# Patient Record
Sex: Male | Born: 1980
Health system: Southern US, Community
[De-identification: ages and names within clinical notes are randomized; demographics above are authoritative.]

## PROBLEM LIST (undated history)

## (undated) DIAGNOSIS — J45909 Unspecified asthma, uncomplicated: Secondary | ICD-10-CM

## (undated) DIAGNOSIS — J302 Other seasonal allergic rhinitis: Secondary | ICD-10-CM

## (undated) DIAGNOSIS — Z8619 Personal history of other infectious and parasitic diseases: Secondary | ICD-10-CM

## (undated) HISTORY — DX: Personal history of other infectious and parasitic diseases: Z86.19

## (undated) HISTORY — DX: Unspecified asthma, uncomplicated: J45.909

## (undated) HISTORY — DX: Other seasonal allergic rhinitis: J30.2

---
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
## 2004-07-21 ENCOUNTER — Ambulatory Visit: Payer: Self-pay | Admitting: Family Medicine

## 2006-01-31 ENCOUNTER — Ambulatory Visit: Payer: Self-pay | Admitting: Family Medicine

## 2006-07-11 ENCOUNTER — Ambulatory Visit: Payer: Self-pay | Admitting: Internal Medicine

## 2006-08-22 ENCOUNTER — Ambulatory Visit: Payer: Self-pay | Admitting: Internal Medicine

## 2006-08-29 ENCOUNTER — Ambulatory Visit: Payer: Self-pay | Admitting: Critical Care Medicine

## 2011-01-20 NOTE — Assessment & Plan Note (Signed)
[facility]                               PULMONARY OFFICE NOTE   Kenneth Nunez, Kenneth Nunez                   MRN:          [id number]  DATE:07/11/2006                            DOB:          1981-03-05    HISTORY:  [age]-year-old white male who was reported to have asthma in  11th or 12th grade, and was treated with p.r.n. inhalers.  He states he  did not require any form of maintenance or even p.r.n. inhaler subsequent to  age 30, and was able to play sports without difficulty.  He was able run up  to 3 miles in 30 minutes as of last March, but is now mostly just working  out with weights.  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 Ellis Parents request.  However, the patient denies any symptoms at all  to suggest asthma.  That is, he denies any nocturnal or exercise-induced  wheezing or shortness of breath, cough, chest tightness or decreased  activity tolerance.   PAST MEDICAL HISTORY:  Significant for asthma as a child only, as noted  above.   ALLERGIES:  None known.   MEDICATIONS:  None regularly.   SOCIAL HISTORY:  He has never smoked.  He presently works Engineer, materials  meters, but wants to become a Company secretary.   FAMILY HISTORY:  Taken in detail on the work sheet, significant for the  absence of atopy or asthma.   REVIEW OF SYSTEMS:  Taken in detail on the work sheet, and significant for  the absence of additional complaints.   PHYSICAL EXAMINATION:  This is a well-developed, muscular white male,  weighing 230 pounds at around 6 feet.  He is afebrile, stable vital signs.  HEENT:  Remarkable for normal turbinates.  The oropharynx is clear.  Dentition is intact, the ear canals were clear bilaterally.  NECK:  Supple without cervical adenopathy or tenderness.  The trachea was  midline, no thyromegaly.  Lung fields are perfectly clear  bilaterally to auscultation and percussion.  There is a regular rate and rhythm without murmur, gallop or rub.  ABDOMEN:  Soft, benign.  EXTREMITIES:  Warm without calf tenderness, cyanosis, clubbing or edema.    PFTs were repeated because of evidence of airflow obstruction on previous  studies, and show an FEV1 to FVC ratio of 59%, but an absolute FEV1 of 86%.  This did not change after bronchodilators.  The lung volumes and diffusion  capacity were normal.  Chest x-ray within the last month was also reviewed,  and is normal.   IMPRESSION:  This patient does indeed have mild persistent evident airflow  obstruction consistent with chronic asthma, but is below the radar screen  because he is not around anything that is triggering airways instability,  and is not exercising consistently at a very high aerobic level (otherwise I  believe he would be symptomatic).  Although he certainly might have a risk  of exacerbating during exposure to irritants, viruses, and certainly from  dust and  fume, I see no reason why he could not work as a IT sales professional and  use respiratory protective equipment effectively.   I would, however, be concerned about the possibility of a low-grade  remodeling here, resulting in chronic irreversible airflow observation,  albeit at a subclinical level, and recommended that the patient try QVAR 40  two puffs twice daily and then return here within 6 weeks for a followup  spirometry to see if have eliminated the pattern of airflow obstruction that  is present and irreversible, albeit extremely mild, at present.   I also spent extra time teaching this patient how to use metered dose  inhaler effectively, which he mastered fairly quickly, and I suggested that,  since he might be prone to exacerbations of asthma, recommended Xopenex 2  puffs every 4 hours as needed.    ______________________________  Charlaine Dalton Sherene Sires, MD, [facility]    MBW/MedQ  DD: 07/11/2006  DT:  07/12/2006  Job #: [id number]   cc:   Arta Silence, MD  Stan Head Cleta Alberts, M.D.

## 2011-01-20 NOTE — Assessment & Plan Note (Signed)
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  I saw him on  November 7 with no symptoms at all, with an FEV1 then of 86% predicted  but a ratio of 59% consistent with moderate airflow obstruction with no  improvement after bronchodilators and diffusion capacity that was  normal.  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 I did, however, question whether or not the  remodeling could be reversed with asthma treatment and initially  approached the problem by recommending Qvar 40 two puffs b.i.d. which he  says he has used regularly.  He has had no symptoms whatsoever while on  the medication (but note he had no symptoms prior to this).   PHYSICAL EXAMINATION NOW:  GENERAL:  He is a pleasant ambulatory white  male in no acute distress.  He is afebrile with normal vital signs.  HEENT:  Unremarkable, pharynx clear, there is no thrush.  Neck supple  without cervical adenopathy or tenderness.  Trachea is midline without  thyromegaly.  LUNG FIELDS:  Perfectly clear bilaterally to auscultation and  percussion.  HEART:  Regular rhythm without murmur, gallop or rub.  ABDOMEN:  Soft, benign.  EXTREMITIES:  Warm without calf tenderness, cyanosis, clubbing, or  edema.   PFTs were repeated and show the almost identical finding except the  FEV1/FVC ratio is up from 59 to 61.   IMPRESSION:  Mild airflow obstruction with no significant variability  typical of asthma.  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   To see to what extent he will improve with more aggressive treatment,  however, I recommended a trial of Symbicort 80/4.5 two puffs b.i.d. and  return here for spirometry in a week.  If this improves his lung  function more so than Qvar then I would continue this indefinitely.   In terms of his employability, I would not hesitate to recommend him for  employment, especially since he can do the aerobic component of the  requirements without any evidence of exercise-induced asthma or airways  instability.  In other words, he has been left because of remodeling  with a mild case of airflow obstruction that leads him to have a  ventilatory limitation that is minimum and would not impact him unless  he was pushed to a very high aerobic level, for  instance, sustained running at a marathon level (note this patient has  proven he can do extended running, at a minimum three 10-minute miles as  recently as  March 2007).     Charlaine Dalton. Sherene Sires, MD, [facility]  Electronically Signed    MBW/MedQ  DD: 08/22/2006  DT: 08/22/2006  Job #: [id number]   cc:   Arta Silence, MD

## 2011-01-20 NOTE — Assessment & Plan Note (Signed)
[facility]                             PULMONARY OFFICE NOTE   MARTEL, GALVAN                   MRN:          [id number]  DATE:08/29/2006                            DOB:          12-01-1980    HISTORY OF PRESENT ILLNESS:  The patient is a 30 year old, white male,  never smoker, patient of Dr. Thurston Hole who was recently seen in the office  one week ago for abnormal spirometry. The patient is applying to the  fire department and was noted to have an abnormal spirometry and was  sent over for evaluation. PFTs showed FEV1 of 86% of the predicted but a  ratio of 59% consistent with moderate air flow obstruction. The patient  was suspected to have low grade chronic asthma with possible remodeling.  The patient was initially started on Qvar and then switched over to  Symbicort 80/4.5 two puffs twice daily last week. The patient reports he  is tolerating medication well. The patient was asymptomatic. Denies any  wheezing, shortness of breath, decreased exercise tolerance, or  nocturnal symptoms.   PAST MEDICAL HISTORY:  Reviewed.   CURRENT MEDICATIONS:  Reviewed.   PHYSICAL EXAMINATION:  VITAL SIGNS:  The patient is afebrile with stable  vital signs.  HEENT:  Unremarkable.  LUNGS:  Lung sounds are clear.  CARDIAC:  Regular rate and rhythm.  ABDOMEN:  Soft and benign.  EXTREMITIES:  Warm without any edema.   DATA:  FEV1 today is 4.01 liters which is up from 79% to 84% of the  predicted. Mid flows are up at 2.5 liters which is up from 39% to 47% of  the predicted.   IMPRESSION/PLAN:  Mild air flow obstruction. The patient will continue  on Symbicort puffs 80/4.5 two puffs twice daily. Will recheck back here  in 2-3 months with Dr. Sherene Sires. 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      Rubye Oaks, NP  Electronically Signed      Charlaine Dalton.  Sherene Sires, MD, [facility]  Electronically Signed   TP/MedQ  DD: 08/30/2006  DT: 08/30/2006  Job #: [id number]

## 2012-02-08 ENCOUNTER — Ambulatory Visit: Payer: Self-pay | Admitting: Family Medicine

## 2012-02-13 ENCOUNTER — Ambulatory Visit: Payer: Self-pay | Admitting: Family Medicine

## 2013-04-16 ENCOUNTER — Encounter: Payer: Self-pay | Admitting: Family Medicine

## 2013-04-16 ENCOUNTER — Ambulatory Visit (INDEPENDENT_AMBULATORY_CARE_PROVIDER_SITE_OTHER): Payer: 59 | Admitting: Family Medicine

## 2013-04-16 VITALS — BP 102/64 | HR 64 | Temp 98.1°F | Ht 72.0 in | Wt 221.0 lb

## 2013-04-16 DIAGNOSIS — J309 Allergic rhinitis, unspecified: Secondary | ICD-10-CM

## 2013-04-16 DIAGNOSIS — J302 Other seasonal allergic rhinitis: Secondary | ICD-10-CM

## 2013-04-16 DIAGNOSIS — Z0000 Encounter for general adult medical examination without abnormal findings: Secondary | ICD-10-CM | POA: Insufficient documentation

## 2013-04-16 DIAGNOSIS — J45909 Unspecified asthma, uncomplicated: Secondary | ICD-10-CM

## 2013-04-16 MED ORDER — ALBUTEROL SULFATE HFA 108 (90 BASE) MCG/ACT IN AERS: 2.0000 | INHALATION_SPRAY | Freq: Four times a day (QID) | RESPIRATORY_TRACT | Status: DC | PRN

## 2013-04-16 NOTE — Assessment & Plan Note (Signed)
Mainly noted with seasonal allergic rhinitis flare. rec albuterol prn.

## 2013-04-16 NOTE — Progress Notes (Signed)
Subjective:    Patient ID: Kenneth Nunez, male    DOB: 10-13-1980, 32 y.o.   MRN: [id number]  HPI CC: new pt , re establish  Prior saw Dr. Hetty Ely.  Would like CPE today. Asthma - rare use of albuterol.  Seasonal.  Currently with some sinus congestion.  L knee - since college told had cartilage issues.  No pain.  Grinding when going up steps.  Seat belt use discussed Sunscreen use discussed.  No recent sun burns.  Caffeine: 2-3 cups coffee/day Lives with wife and daughter, 1 dog, 1 fish Occ: Firefighter Edu: bachelor's Activity: working out Diet: some water, fruits/vegetables daily  Preventative: Tetanus 2013. Blood work at work.  Medications and allergies reviewed and updated in chart.  Past histories reviewed and updated if relevant as below. There are no active problems to display for this patient.  Past Medical History  Diagnosis Date  . Asthma     mild, intermittent  . History of chicken pox   . Seasonal allergies    Past Surgical History  Procedure Laterality Date  . Tympanoplasty  1983   History  Substance Use Topics  . Smoking status: Never Smoker   . Smokeless tobacco: Former Neurosurgeon  . Alcohol Use: Yes     Comment: rarely   Family History  Problem Relation Age of Onset  . Depression Mother   . Hypertension Mother   . Hyperlipidemia Father   . CAD Maternal Grandmother 34    MI  . Hyperlipidemia Paternal Grandmother   . Cancer Neg Hx   . Stroke Neg Hx    No Known Allergies No current outpatient prescriptions on file prior to visit.   No current facility-administered medications on file prior to visit.     Review of Systems  Constitutional: Negative for fever, chills, activity change, appetite change, fatigue and unexpected weight change.  HENT: Negative for hearing loss and neck pain.   Eyes: Negative for visual disturbance.  Respiratory: Negative for cough, chest tightness, shortness of breath and wheezing.   Cardiovascular: Negative  for chest pain, palpitations and leg swelling.  Gastrointestinal: Negative for nausea, vomiting, abdominal pain, diarrhea, constipation, blood in stool and abdominal distention.  Genitourinary: Negative for hematuria and difficulty urinating.  Musculoskeletal: Negative for myalgias and arthralgias.  Skin: Negative for rash.  Neurological: Negative for dizziness, seizures, syncope and headaches.  Hematological: Negative for adenopathy. Does not bruise/bleed easily.  Psychiatric/Behavioral: Negative for dysphoric mood. The patient is not nervous/anxious.        Objective:   Physical Exam  Nursing note and vitals reviewed. Constitutional: He is oriented to person, place, and time. He appears well-developed and well-nourished. No distress.  HENT:  Head: Normocephalic and atraumatic.  Right Ear: Hearing, tympanic membrane, external ear and ear canal normal.  Left Ear: Hearing, tympanic membrane, external ear and ear canal normal.  Mouth/Throat: Oropharynx is clear and moist. No oropharyngeal exudate.  Eyes: Conjunctivae and EOM are normal. Pupils are equal, round, and reactive to light. No scleral icterus.  Neck: Normal range of motion. Neck supple. No thyromegaly present.  Cardiovascular: Normal rate, regular rhythm, normal heart sounds and intact distal pulses.   No murmur heard. Pulses:      Radial pulses are 2+ on the right side, and 2+ on the left side.  Pulmonary/Chest: Effort normal. No respiratory distress. He has no decreased breath sounds. He has wheezes (faint R sided). He has no rhonchi. He has no rales.  Abdominal: Soft. Bowel sounds  are normal. He exhibits no distension and no mass. There is no tenderness. There is no rebound and no guarding.  Musculoskeletal: Normal range of motion. He exhibits no edema.  Lymphadenopathy:    He has no cervical adenopathy.  Neurological: He is alert and oriented to person, place, and time.  CN grossly intact, station and gait intact  Skin:  Skin is warm and dry. No rash noted.  R AC fossa with large brown pigmented nevus, unchanging  Psychiatric: He has a normal mood and affect. His behavior is normal. Judgment and thought content normal.       Assessment & Plan:

## 2013-04-16 NOTE — Patient Instructions (Signed)
Check if tetanus shot was Td or Tdap (with pertussis) Bring me copy of next blood work to update your chart. I will review old records  Return as needed or in 2 years for next physical. I've sent albuterol to your pharmacy.

## 2013-04-16 NOTE — Assessment & Plan Note (Signed)
Preventative protocols reviewed and updated unless pt declined. Discussed healthy diet and lifestyle. He will check on recent tetanus. I asked him to bring me copy of upcoming blood work at work.

## 2013-08-31 ENCOUNTER — Emergency Department (HOSPITAL_COMMUNITY)
Admission: EM | Admit: 2013-08-31 | Discharge: 2013-08-31 | Disposition: A | Discharge status: Home / Self Care | Payer: 59 | Source: Home / Self Care | Attending: Family Medicine | Admitting: Family Medicine

## 2013-08-31 ENCOUNTER — Encounter (HOSPITAL_COMMUNITY): Payer: Self-pay | Admitting: Emergency Medicine

## 2013-08-31 DIAGNOSIS — J111 Influenza due to unidentified influenza virus with other respiratory manifestations: Secondary | ICD-10-CM

## 2013-08-31 MED ORDER — OSELTAMIVIR PHOSPHATE 75 MG PO CAPS: 75.0000 mg | ORAL_CAPSULE | Freq: Two times a day (BID) | ORAL | Status: DC

## 2013-08-31 MED ORDER — IBUPROFEN 800 MG PO TABS: 800.0000 mg | ORAL_TABLET | Freq: Three times a day (TID) | ORAL | Status: DC

## 2013-08-31 MED ORDER — HYDROCODONE-HOMATROPINE 5-1.5 MG/5ML PO SYRP: 5.0000 mL | ORAL_SOLUTION | Freq: Four times a day (QID) | ORAL | Status: DC | PRN

## 2013-08-31 NOTE — ED Notes (Signed)
Pt  Has  Symptoms  Of  Cough  Fever  And  Body  Aches /  Headache  X  2  Days    -  Pt  Sitting  Upright on  Exam table  Speaking in  Complete       sentances

## 2013-08-31 NOTE — ED Provider Notes (Signed)
CSN: [id number]     Arrival date & time 08/31/13  1825 History   First MD Initiated Contact with Patient 08/31/13 1943     Chief Complaint  Patient presents with  . Fever   (Consider location/radiation/quality/duration/timing/severity/associated sxs/prior Treatment) Patient is a 32 y.o. male presenting with cough. The history is provided by the patient. No language interpreter was used.  Cough Cough characteristics:  Productive Sputum characteristics:  Clear Severity:  Moderate Onset quality:  Gradual Timing:  Constant Chronicity:  New Relieved by:  Nothing Ineffective treatments:  None tried Associated symptoms: fever, headaches, myalgias and sore throat     Past Medical History  Diagnosis Date  . Extrinsic asthma     mild, intermittent  . History of chicken pox   . Seasonal allergic rhinitis    Past Surgical History  Procedure Laterality Date  . Tympanoplasty  1983   Family History  Problem Relation Age of Onset  . Depression Mother   . Hypertension Mother   . Hyperlipidemia Father   . CAD Maternal Grandmother 59    MI  . Hyperlipidemia Paternal Grandmother   . Cancer Neg Hx   . Stroke Neg Hx    History  Substance Use Topics  . Smoking status: Never Smoker   . Smokeless tobacco: Former Neurosurgeon  . Alcohol Use: Yes     Comment: rarely    Review of Systems  Constitutional: Positive for fever.  HENT: Positive for sore throat.   Respiratory: Positive for cough.   Musculoskeletal: Positive for myalgias.  Neurological: Positive for headaches.  All other systems reviewed and are negative.    Allergies  Review of patient's allergies indicates no known allergies.  Home Medications   Current Outpatient Rx  Name  Route  Sig  Dispense  Refill  . albuterol (PROVENTIL HFA;VENTOLIN HFA) 108 (90 BASE) MCG/ACT inhaler   Inhalation   Inhale 2 puffs into the lungs every 6 (six) hours as needed for wheezing.   1 Inhaler   3    Pulse 105  Temp(Src) 102.7 F (39.3  C) (Oral)  Resp 19  SpO2 93% Physical Exam  Nursing note and vitals reviewed. Constitutional: He is oriented to person, place, and time. He appears well-developed and well-nourished.  HENT:  Head: Normocephalic.  Right Ear: External ear normal.  Left Ear: External ear normal.  Eyes: EOM are normal. Pupils are equal, round, and reactive to light.  Neck: Normal range of motion.  Cardiovascular: Normal rate, regular rhythm and normal heart sounds.   Pulmonary/Chest: Effort normal.  Abdominal: He exhibits no distension.  Musculoskeletal: Normal range of motion.  Neurological: He is alert and oriented to person, place, and time.  Skin: Skin is warm.  Psychiatric: He has a normal mood and affect.    ED Course  Procedures (including critical care time) Labs Review Labs Reviewed - No data to display Imaging Review No results found.  EKG Interpretation    Date/Time:    Ventricular Rate:    PR Interval:    QRS Duration:   QT Interval:    QTC Calculation:   R Axis:     Text Interpretation:              MDM   1. Influenza    Ibuprofen 800mg ,  tamiflu and hydromet for cough    Lonia Skinner [place], PA-C 08/31/13 2005

## 2013-08-31 NOTE — ED Provider Notes (Signed)
Medical screening examination/treatment/procedure(s) were performed by non-physician practitioner and as supervising physician I was immediately available for consultation/collaboration.  Leslee Home, M.D.  Reuben Likes, MD 08/31/13 2107

## 2013-12-01 ENCOUNTER — Encounter: Payer: Self-pay | Admitting: Family Medicine

## 2013-12-01 ENCOUNTER — Ambulatory Visit (INDEPENDENT_AMBULATORY_CARE_PROVIDER_SITE_OTHER): Payer: 59 | Admitting: Family Medicine

## 2013-12-01 VITALS — BP 116/70 | HR 60 | Temp 97.8°F | Wt 228.0 lb

## 2013-12-01 DIAGNOSIS — H6120 Impacted cerumen, unspecified ear: Secondary | ICD-10-CM

## 2013-12-01 NOTE — Assessment & Plan Note (Signed)
R ear successfully manually disimpacted with plastic curette. L ear unsucessful attempts with plastic curette, alligator forceps and irrigation, so will refer to ENT, pt agrees.

## 2013-12-01 NOTE — Patient Instructions (Signed)
Pass by Kenneth Nunez's office for referral to ENT for ear wax removal.

## 2013-12-01 NOTE — Progress Notes (Signed)
   BP 116/70  Pulse 60  Temp(Src) 97.8 F (36.6 C) (Oral)  Wt 228 lb (103.42 kg)   CC: bilateral ear congestion  Subjective:    Patient ID: Kenneth Nunez, male    DOB: [birth year], 33 y.o.   MRN: [id number]  HPI: Kenneth Nunez is a 33 y.o. male presenting on 12/01/2013 for congestion in both ears   Over last week noticing some increased congestion in ears and muffled hearing.  No ear pain or drainage.  No fevers/chills, no sinus congestion or other allergic sxs like sneezing or rhinorrhea.  Tried debrox OTC drops without improvement. H/o tympanostomy tubes as infant.  Relevant past medical, surgical, family and social history reviewed and updated as indicated.  Allergies and medications reviewed and updated. Current Outpatient Prescriptions on File Prior to Visit  Medication Sig  . albuterol (PROVENTIL HFA;VENTOLIN HFA) 108 (90 BASE) MCG/ACT inhaler Inhale 2 puffs into the lungs every 6 (six) hours as needed for wheezing.  Marland Kitchen ibuprofen (ADVIL,MOTRIN) 800 MG tablet Take 1 tablet (800 mg total) by mouth 3 (three) times daily.   No current facility-administered medications on file prior to visit.    Review of Systems Per HPI unless specifically indicated above    Objective:    BP 116/70  Pulse 60  Temp(Src) 97.8 F (36.6 C) (Oral)  Wt 228 lb (103.42 kg)  Physical Exam  Nursing note and vitals reviewed. Constitutional: He appears well-developed and well-nourished. No distress.  HENT:  Head: Normocephalic and atraumatic.  Right Ear: Tympanic membrane and external ear normal.  Left Ear: External ear normal.  Mouth/Throat: Oropharynx is clear and moist. No oropharyngeal exudate.  R ear - hard dry wax removed with plastic curette, TM visualized and pearly grey L ear - canal full of soft wax that is not easily cleared with plastic curette so irrigation was performed - with remaining hard wax deep in canal unable to be fully removed with alligator forceps.  Unable to  visualize TM.  Post-irrigation irritation of external canal.  Eyes: Conjunctivae and EOM are normal. Pupils are equal, round, and reactive to light.  Neck: Normal range of motion. Neck supple.  Lymphadenopathy:    He has no cervical adenopathy.   No results found for this or any previous visit.    Assessment & Plan:   Problem List Items Addressed This Visit   Cerumen impaction - Primary     R ear successfully manually disimpacted with plastic curette. L ear unsucessful attempts with plastic curette, alligator forceps and irrigation, so will refer to ENT, pt agrees.    Relevant Orders      Ambulatory referral to ENT       Follow up plan: Return if symptoms worsen or fail to improve.

## 2013-12-01 NOTE — Progress Notes (Signed)
Pre visit review using our clinic review tool, if applicable. No additional management support is needed unless otherwise documented below in the visit note. 

## 2014-05-07 LAB — CBC
HGB: 15.8 g/dL
PLATELET COUNT: 205
WBC: 6.5

## 2014-05-08 LAB — COMPREHENSIVE METABOLIC PANEL
ALK PHOS: 59 U/L
ALT: 19
AST: 23 U/L
BUN: 12 mg/dL (ref 4–21)
Creat: 0.97
GLUCOSE: 104 mg/dL
Potassium: 4 mmol/L
Sodium: 139 mmol/L (ref 137–147)
Total Bilirubin: 0.5 mg/dL

## 2014-05-08 LAB — LIPID PANEL
Cholesterol, Total: 216
HDL: 64 mg/dL (ref 35–70)
LDL CALC: 139
TRIGLYCERIDES: 63

## 2014-06-05 ENCOUNTER — Ambulatory Visit (INDEPENDENT_AMBULATORY_CARE_PROVIDER_SITE_OTHER): Payer: 59 | Admitting: Family Medicine

## 2014-06-05 ENCOUNTER — Encounter: Payer: Self-pay | Admitting: Family Medicine

## 2014-06-05 VITALS — BP 104/60 | HR 70 | Temp 97.7°F | Wt 229.2 lb

## 2014-06-05 DIAGNOSIS — T148XXA Other injury of unspecified body region, initial encounter: Secondary | ICD-10-CM

## 2014-06-05 DIAGNOSIS — T148 Other injury of unspecified body region: Secondary | ICD-10-CM

## 2014-06-05 MED ORDER — CEPHALEXIN 500 MG PO CAPS: 500.0000 mg | ORAL_CAPSULE | Freq: Three times a day (TID) | ORAL | Status: DC

## 2014-06-05 NOTE — Patient Instructions (Addendum)
Take antibiotic course keflex 500mg  three times daily for 1 week. If persistent pain/redness after antibiotic, or any worsening let us know for referral to hand surgeon.

## 2014-06-05 NOTE — Progress Notes (Addendum)
   BP 104/60  Pulse 70  Temp(Src) 97.7 F (36.5 C) (Oral)  Wt 229 lb 4 oz (103.987 kg)  SpO2 96%   CC: hand pain  Subjective:    Patient ID: Kenneth Nunez, male    DOB: 09/13/1980, 33 y.o.   MRN: [id number]  HPI: Kenneth Nunez is a 33 y.o. male presenting on 06/05/2014 for Hand Pain   A few weeks ago while sanding and building daughter a bed suffered splinter into R palmar hand at 4th MCP. Area closed over. Last week splinter piece came out of opening at area of pain. Now having some pain lateral medial 3rd finger between MCP and PIP joints.  No fever, no warmth of fingers. Mild redness and swelling.  Relevant past medical, surgical, family and social history reviewed and updated as indicated.  Allergies and medications reviewed and updated. Current Outpatient Prescriptions on File Prior to Visit  Medication Sig  . albuterol (PROVENTIL HFA;VENTOLIN HFA) 108 (90 BASE) MCG/ACT inhaler Inhale 2 puffs into the lungs every 6 (six) hours as needed for wheezing.  Marland Kitchen ibuprofen (ADVIL,MOTRIN) 800 MG tablet Take 1 tablet (800 mg total) by mouth 3 (three) times daily.   No current facility-administered medications on file prior to visit.    Review of Systems Per HPI unless specifically indicated above    Objective:    BP 104/60  Pulse 70  Temp(Src) 97.7 F (36.5 C) (Oral)  Wt 229 lb 4 oz (103.987 kg)  SpO2 96%  Physical Exam  Nursing note and vitals reviewed. Constitutional: He appears well-developed and well-nourished. No distress.  Musculoskeletal: He exhibits no edema.  Right hand: Dry skin at palmar base of 4th MCP but no opening noted. Mild erythema, tenderness and induration along medial 3rd digit between MCP and PIP, no obvious foreign body palpated. FROM 3rd and 4th MCP/PIP.  Skin: Skin is warm and dry.  Psychiatric: He has a normal mood and affect.   No results found for this or any previous visit.    Assessment & Plan:   Problem List Items Addressed This  Visit   Splinter in skin - Primary     R palm, occurred several weeks ago, then last week part of splinter self-extruded out. Currently no evidence of infection at base of 4th MCP, but there is mild induration and erythema at medial 3rd digit between MCP and PIP - ?cellulitis vs retained splinter. Will treat as cellulitis with 7d keflex course. Discussed with patient - if persistent pain/swelling will recommend hand surgery referral. Pt agrees.  Tetanus UTD - per patient received 2013.        Follow up plan: Return if symptoms worsen or fail to improve.

## 2014-06-05 NOTE — Assessment & Plan Note (Addendum)
R palm, occurred several weeks ago, then last week part of splinter self-extruded out. Currently no evidence of infection at base of 4th MCP, but there is mild induration and erythema at medial 3rd digit between MCP and PIP - ?cellulitis vs retained splinter. Will treat as cellulitis with 7d keflex course. Discussed with patient - if persistent pain/swelling will recommend hand surgery referral. Pt agrees.  Tetanus UTD - per patient received 2013.

## 2014-06-05 NOTE — Progress Notes (Signed)
Pre visit review using our clinic review tool, if applicable. No additional management support is needed unless otherwise documented below in the visit note. 

## 2014-06-11 ENCOUNTER — Ambulatory Visit (INDEPENDENT_AMBULATORY_CARE_PROVIDER_SITE_OTHER): Payer: 59 | Admitting: *Deleted

## 2014-06-11 DIAGNOSIS — Z23 Encounter for immunization: Secondary | ICD-10-CM

## 2014-06-19 ENCOUNTER — Encounter: Payer: 59 | Admitting: Family Medicine

## 2014-07-27 ENCOUNTER — Encounter: Payer: Self-pay | Admitting: *Deleted

## 2016-06-30 ENCOUNTER — Other Ambulatory Visit: Payer: Self-pay | Admitting: Otolaryngology

## 2016-06-30 DIAGNOSIS — H9042 Sensorineural hearing loss, unilateral, left ear, with unrestricted hearing on the contralateral side: Secondary | ICD-10-CM

## 2016-07-11 ENCOUNTER — Ambulatory Visit
Admission: RE | Admit: 2016-07-11 | Discharge: 2016-07-11 | Disposition: A | Discharge status: Home / Self Care | Payer: 59 | Source: Ambulatory Visit | Attending: Otolaryngology | Admitting: Otolaryngology

## 2016-07-11 DIAGNOSIS — H9042 Sensorineural hearing loss, unilateral, left ear, with unrestricted hearing on the contralateral side: Secondary | ICD-10-CM

## 2016-07-11 MED ORDER — GADOBENATE DIMEGLUMINE 529 MG/ML IV SOLN
20.0000 mL | Freq: Once | INTRAVENOUS | Status: AC | PRN
  Administered 2016-07-11: 20 mL via INTRAVENOUS

## 2017-02-06 ENCOUNTER — Encounter: Payer: 59 | Admitting: Family Medicine

## 2017-02-20 DIAGNOSIS — H5213 Myopia, bilateral: Secondary | ICD-10-CM | POA: Diagnosis not present

## 2017-07-17 LAB — BASIC METABOLIC PANEL
Creatinine: 1 (ref 0.6–1.3)
Glucose: 83

## 2017-07-17 LAB — LIPID PANEL
Cholesterol: 239 — AB (ref 0–200)
HDL: 67 (ref 35–70)
LDL CALC: 153
TRIGLYCERIDES: 87 (ref 40–160)

## 2017-07-17 LAB — CBC AND DIFFERENTIAL
Hemoglobin: 16.6 (ref 13.5–17.5)
PLATELETS: 206 (ref 150–399)
WBC: 6.6

## 2017-07-17 LAB — HEPATIC FUNCTION PANEL
ALK PHOS: 90 (ref 25–125)
ALT: 30 (ref 10–40)
AST: 23 (ref 14–40)

## 2017-08-08 LAB — PULMONARY FUNCTION TEST

## 2017-08-23 ENCOUNTER — Telehealth: Payer: Self-pay | Admitting: Family Medicine

## 2017-08-23 NOTE — Telephone Encounter (Signed)
Copied from [place] [id number]. Topic: Quick Communication - See Telephone Encounter >> Aug 23, 2017 10:22 AM Boyd Kerbs wrote: CRM for notification. See Telephone encounter for:  He had labs draws at [place] drawn in the last 3 months. He was not sure if would need them again or can requests the results from them.  Please call if needs to schedule labs 08/23/17.

## 2017-08-23 NOTE — Telephone Encounter (Signed)
Ok to hold off on labs but I'd like him to get a copy of recent labs for our records. We don't have access to these.  If we can't get labs, may draw them when he sees me at [facility].

## 2017-08-23 NOTE — Telephone Encounter (Signed)
Left message on vm per dpr relaying message per Dr. G.  

## 2017-09-20 ENCOUNTER — Encounter: Payer: 59 | Admitting: Family Medicine

## 2017-09-24 ENCOUNTER — Encounter: Payer: 59 | Admitting: Family Medicine

## 2017-10-05 HISTORY — PX: VASECTOMY: SHX75

## 2017-10-12 DIAGNOSIS — Z302 Encounter for sterilization: Secondary | ICD-10-CM | POA: Diagnosis not present

## 2017-10-26 ENCOUNTER — Encounter: Payer: 59 | Admitting: Family Medicine

## 2017-11-07 ENCOUNTER — Ambulatory Visit (INDEPENDENT_AMBULATORY_CARE_PROVIDER_SITE_OTHER): Payer: 59 | Admitting: Family Medicine

## 2017-11-07 ENCOUNTER — Encounter: Payer: Self-pay | Admitting: Family Medicine

## 2017-11-07 VITALS — BP 118/80 | HR 61 | Temp 97.9°F | Ht 72.0 in | Wt 234.5 lb

## 2017-11-07 DIAGNOSIS — E7800 Pure hypercholesterolemia, unspecified: Secondary | ICD-10-CM | POA: Diagnosis not present

## 2017-11-07 DIAGNOSIS — E669 Obesity, unspecified: Secondary | ICD-10-CM

## 2017-11-07 DIAGNOSIS — J4520 Mild intermittent asthma, uncomplicated: Secondary | ICD-10-CM

## 2017-11-07 DIAGNOSIS — H9042 Sensorineural hearing loss, unilateral, left ear, with unrestricted hearing on the contralateral side: Secondary | ICD-10-CM

## 2017-11-07 DIAGNOSIS — Z0000 Encounter for general adult medical examination without abnormal findings: Secondary | ICD-10-CM

## 2017-11-07 DIAGNOSIS — E785 Hyperlipidemia, unspecified: Secondary | ICD-10-CM | POA: Insufficient documentation

## 2017-11-07 DIAGNOSIS — IMO0001 Reserved for inherently not codable concepts without codable children: Secondary | ICD-10-CM

## 2017-11-07 DIAGNOSIS — Z6825 Body mass index (BMI) 25.0-25.9, adult: Secondary | ICD-10-CM | POA: Insufficient documentation

## 2017-11-07 DIAGNOSIS — Z23 Encounter for immunization: Secondary | ICD-10-CM

## 2017-11-07 MED ORDER — ALBUTEROL SULFATE HFA 108 (90 BASE) MCG/ACT IN AERS: 2.0000 | INHALATION_SPRAY | Freq: Four times a day (QID) | RESPIRATORY_TRACT | 3 refills | Status: DC | PRN

## 2017-11-07 NOTE — Assessment & Plan Note (Signed)
Reviewed healthy diet and lifestyle changes.

## 2017-11-07 NOTE — Progress Notes (Signed)
BP 118/80 (BP Location: Left Arm, Patient Position: Sitting, Cuff Size: Normal)   Pulse 61   Temp 97.9 F (36.6 C) (Oral)   Ht 6' (1.829 m)   Wt 234 lb 8 oz (106.4 kg)   SpO2 97%   PF 800 L/min   BMI 31.80 kg/m    CC: [facility] Subjective:    Patient ID: Kenneth Nunez, male    DOB: 04-29-1981, 37 y.o.   MRN: [id number]  HPI: Kenneth Nunez is a 37 y.o. male presenting on 11/07/2017 for Annual Exam (Pt provided most recent labs (07/2017). )   Last seen 06/2014  Work physical yearly - brings records including labs which were reviewed, as well as audiogram showing L mild hearing loss at low frequencies (chronic tinnitus, has seen [facility] ENT, normal MRI) and spirometry showing moderate obstruction (FVC 93%, FEV1 72%, ratio 0.64)  Known mild intermittent asthma - triggers are mowing lawn during pollen spring season. Rare albuterol use. Requests albuterol refilled. No trouble at work.  Preventative: Flu shot yearly Td 2013, Tdap today. Seat belt use discussed Sunscreen use discussed. No changing moles on skin. Eye doctor regularly. Non smoker Alcohol - 3-4 beers/wk  Caffeine: 2-3 cups coffee/day Lives with wife, 3 children (2 daughters and 1 son) Occ: [employer]: bachelor's Activity: no regular exercise Diet: water, decaf tea or coffee, fruits/vegetables some   Relevant past medical, surgical, family and social history reviewed and updated as indicated. Interim medical history since our last visit reviewed. Allergies and medications reviewed and updated. Outpatient Medications Prior to Visit  Medication Sig Dispense Refill  . albuterol (PROVENTIL HFA;VENTOLIN HFA) 108 (90 BASE) MCG/ACT inhaler Inhale 2 puffs into the lungs every 6 (six) hours as needed for wheezing. 1 Inhaler 3  . cephALEXin (KEFLEX) 500 MG capsule Take 1 capsule (500 mg total) by mouth 3 (three) times daily. 21 capsule 0  . ibuprofen (ADVIL,MOTRIN) 800 MG tablet Take 1 tablet (800 mg total) by  mouth 3 (three) times daily. 21 tablet 0   No facility-administered medications prior to visit.      Per HPI unless specifically indicated in ROS section below Review of Systems  Constitutional: Negative for activity change, appetite change, chills, fatigue, fever and unexpected weight change.  HENT: Negative for hearing loss.   Eyes: Negative for visual disturbance.  Respiratory: Negative for cough, chest tightness, shortness of breath and wheezing.   Cardiovascular: Negative for chest pain, palpitations and leg swelling.  Gastrointestinal: Negative for abdominal distention, abdominal pain, blood in stool, constipation, diarrhea, nausea and vomiting.  Genitourinary: Negative for difficulty urinating and hematuria.  Musculoskeletal: Negative for arthralgias, myalgias and neck pain.  Skin: Negative for rash.  Neurological: Negative for dizziness, seizures, syncope and headaches.  Hematological: Negative for adenopathy. Does not bruise/bleed easily.  Psychiatric/Behavioral: Negative for dysphoric mood. The patient is not nervous/anxious.        Objective:    BP 118/80 (BP Location: Left Arm, Patient Position: Sitting, Cuff Size: Normal)   Pulse 61   Temp 97.9 F (36.6 C) (Oral)   Ht 6' (1.829 m)   Wt 234 lb 8 oz (106.4 kg)   SpO2 97%   PF 800 L/min   BMI 31.80 kg/m   Wt Readings from Last 3 Encounters:  11/07/17 234 lb 8 oz (106.4 kg)  06/05/14 229 lb 4 oz (104 kg)  12/01/13 228 lb (103.4 kg)    Physical Exam  Constitutional: He is oriented to person, place, and  time. He appears well-developed and well-nourished. No distress.  HENT:  Head: Normocephalic and atraumatic.  Right Ear: Hearing, tympanic membrane, external ear and ear canal normal.  Left Ear: Hearing, tympanic membrane, external ear and ear canal normal.  Nose: Nose normal.  Mouth/Throat: Uvula is midline, oropharynx is clear and moist and mucous membranes are normal. No oropharyngeal exudate, posterior  oropharyngeal edema or posterior oropharyngeal erythema.  Eyes: Conjunctivae and EOM are normal. Pupils are equal, round, and reactive to light. No scleral icterus.  Neck: Normal range of motion. Neck supple. No thyromegaly present.  Cardiovascular: Normal rate, regular rhythm, normal heart sounds and intact distal pulses.  No murmur heard. Pulses:      Radial pulses are 2+ on the right side, and 2+ on the left side.  Pulmonary/Chest: Effort normal and breath sounds normal. No respiratory distress. He has no wheezes. He has no rales.  Abdominal: Soft. Bowel sounds are normal. He exhibits no distension and no mass. There is no tenderness. There is no rebound and no guarding.  Musculoskeletal: Normal range of motion. He exhibits no edema.  Lymphadenopathy:    He has no cervical adenopathy.  Neurological: He is alert and oriented to person, place, and time.  CN grossly intact, station and gait intact  Skin: Skin is warm and dry. No rash noted.  Psychiatric: He has a normal mood and affect. His behavior is normal. Judgment and thought content normal.  Nursing note and vitals reviewed.   Baseline PF = 800 Expected for age/height = 640    Assessment & Plan:   Problem List Items Addressed This Visit    Asymmetrical left sensorineural hearing loss    S/p ENT eval with reassuring MRI - low frequencies       Extrinsic asthma    Albuterol refilled. Baseline peak flow today. Brings spirometry from work showing moderate obstruction (FVC 93%, FEV1 72%, ratio 0.64). No post-alb measurements taken.       Relevant Medications   albuterol (PROVENTIL HFA;VENTOLIN HFA) 108 (90 Base) MCG/ACT inhaler   Healthcare maintenance - Primary    Preventative protocols reviewed and updated unless pt declined. Discussed healthy diet and lifestyle.  Tdap today.       Hyperlipidemia    Chronic. Reviewed healthy diet choices to improve levels. The ASCVD Risk score Mikey Bussing DC Jr., et al., 2013) failed to  calculate for the following reasons:   The 2013 ASCVD risk score is only valid for ages 99 to 8       Obesity, Class I, BMI 30-34.9    Reviewed healthy diet and lifestyle changes.        Other Visit Diagnoses    Need for Tdap vaccination       Relevant Orders   Tdap vaccine greater than or equal to 7yo IM       Meds ordered this encounter  Medications  . albuterol (PROVENTIL HFA;VENTOLIN HFA) 108 (90 Base) MCG/ACT inhaler    Sig: Inhale 2 puffs into the lungs every 6 (six) hours as needed for wheezing.    Dispense:  1 Inhaler    Refill:  3   Orders Placed This Encounter  Procedures  . Tdap vaccine greater than or equal to 7yo IM    Follow up plan: Return in about 2 years (around 11/08/2019) for annual exam, prior fasting for blood work.  Ria Bush, MD

## 2017-11-07 NOTE — Assessment & Plan Note (Signed)
Albuterol refilled. Baseline peak flow today. Brings spirometry from work showing moderate obstruction (FVC 93%, FEV1 72%, ratio 0.64). No post-alb measurements taken.

## 2017-11-07 NOTE — Assessment & Plan Note (Addendum)
Chronic. Reviewed healthy diet choices to improve levels. The ASCVD Risk score Kenneth Bussing DC Jr., et al., 2013) failed to calculate for the following reasons:   The 2013 ASCVD risk score is only valid for ages 48 to 84

## 2017-11-07 NOTE — Patient Instructions (Addendum)
Peak flow today  Tdap today (tetanus and pertussis). Work on low cholesterol diet - more fruits/vegetables, more legumes (nuts, beans, lentils) to help lower LDL bad cholesterol You are doing well today. Return as needed or in 2 years for next physical.   Health Maintenance, Male A healthy lifestyle and preventive care is important for your health and wellness. Ask your health care provider about what schedule of regular examinations is right for you. What should I know about weight and diet? Eat a Healthy Diet  Eat plenty of vegetables, fruits, whole grains, low-fat dairy products, and lean protein.  Do not eat a lot of foods high in solid fats, added sugars, or salt.  Maintain a Healthy Weight Regular exercise can help you achieve or maintain a healthy weight. You should:  Do at least 150 minutes of exercise each week. The exercise should increase your heart rate and make you sweat (moderate-intensity exercise).  Do strength-training exercises at least twice a week.  Watch Your Levels of Cholesterol and Blood Lipids  Have your blood tested for lipids and cholesterol every 5 years starting at 36 years of age. If you are at high risk for heart disease, you should start having your blood tested when you are 36 years old. You may need to have your cholesterol levels checked more often if: ? Your lipid or cholesterol levels are high. ? You are older than 36 years of age. ? You are at high risk for heart disease.  What should I know about cancer screening? Many types of cancers can be detected early and may often be prevented. Lung Cancer  You should be screened every year for lung cancer if: ? You are a current smoker who has smoked for at least 30 years. ? You are a former smoker who has quit within the past 15 years.  Talk to your health care provider about your screening options, when you should start screening, and how often you should be screened.  Colorectal Cancer  Routine  colorectal cancer screening usually begins at 36 years of age and should be repeated every 5-10 years until you are 36 years old. You may need to be screened more often if early forms of precancerous polyps or small growths are found. Your health care provider may recommend screening at an earlier age if you have risk factors for colon cancer.  Your health care provider may recommend using home test kits to check for hidden blood in the stool.  A small camera at the end of a tube can be used to examine your colon (sigmoidoscopy or colonoscopy). This checks for the earliest forms of colorectal cancer.  Prostate and Testicular Cancer  Depending on your age and overall health, your health care provider may do certain tests to screen for prostate and testicular cancer.  Talk to your health care provider about any symptoms or concerns you have about testicular or prostate cancer.  Skin Cancer  Check your skin from head to toe regularly.  Tell your health care provider about any new moles or changes in moles, especially if: ? There is a change in a mole's size, shape, or color. ? You have a mole that is larger than a pencil eraser.  Always use sunscreen. Apply sunscreen liberally and repeat throughout the day.  Protect yourself by wearing long sleeves, pants, a wide-brimmed hat, and sunglasses when outside.  What should I know about heart disease, diabetes, and high blood pressure?  If you are 18-39 years  of age, have your blood pressure checked every 3-5 years. If you are 38 years of age or older, have your blood pressure checked every year. You should have your blood pressure measured twice-once when you are at a hospital or clinic, and once when you are not at a hospital or clinic. Record the average of the two measurements. To check your blood pressure when you are not at a hospital or clinic, you can use: ? An automated blood pressure machine at a pharmacy. ? A home blood pressure  monitor.  Talk to your health care provider about your target blood pressure.  If you are between 53-9 years old, ask your health care provider if you should take aspirin to prevent heart disease.  Have regular diabetes screenings by checking your fasting blood sugar level. ? If you are at a normal weight and have a low risk for diabetes, have this test once every three years after the age of 57. ? If you are overweight and have a high risk for diabetes, consider being tested at a younger age or more often.  A one-time screening for abdominal aortic aneurysm (AAA) by ultrasound is recommended for men aged 59-75 years who are current or former smokers. What should I know about preventing infection? Hepatitis B If you have a higher risk for hepatitis B, you should be screened for this virus. Talk with your health care provider to find out if you are at risk for hepatitis B infection. Hepatitis C Blood testing is recommended for:  Everyone born from [birth year] through 1964.  Anyone with known risk factors for hepatitis C.  Sexually Transmitted Diseases (STDs)  You should be screened each year for STDs including gonorrhea and chlamydia if: ? You are sexually active and are younger than 36 years of age. ? You are older than 36 years of age and your health care provider tells you that you are at risk for this type of infection. ? Your sexual activity has changed since you were last screened and you are at an increased risk for chlamydia or gonorrhea. Ask your health care provider if you are at risk.  Talk with your health care provider about whether you are at high risk of being infected with HIV. Your health care provider may recommend a prescription medicine to help prevent HIV infection.  What else can I do?  Schedule regular health, dental, and eye exams.  Stay current with your vaccines (immunizations).  Do not use any tobacco products, such as cigarettes, chewing tobacco, and  e-cigarettes. If you need help quitting, ask your health care provider.  Limit alcohol intake to no more than 2 drinks per day. One drink equals 12 ounces of beer, 5 ounces of wine, or 1 ounces of hard liquor.  Do not use street drugs.  Do not share needles.  Ask your health care provider for help if you need support or information about quitting drugs.  Tell your health care provider if you often feel depressed.  Tell your health care provider if you have ever been abused or do not feel safe at home. This information is not intended to replace advice given to you by your health care provider. Make sure you discuss any questions you have with your health care provider. Document Released: 02/17/2008 Document Revised: 04/19/2016 Document Reviewed: 05/25/2015 Elsevier Interactive Patient Education  [employer].

## 2017-11-07 NOTE — Assessment & Plan Note (Addendum)
S/p ENT eval with reassuring MRI - low frequencies

## 2017-11-07 NOTE — Assessment & Plan Note (Addendum)
Preventative protocols reviewed and updated unless pt declined. Discussed healthy diet and lifestyle.  Tdap today.

## 2018-01-15 DIAGNOSIS — L821 Other seborrheic keratosis: Secondary | ICD-10-CM | POA: Diagnosis not present

## 2018-01-15 DIAGNOSIS — L812 Freckles: Secondary | ICD-10-CM | POA: Diagnosis not present

## 2018-01-15 DIAGNOSIS — D2261 Melanocytic nevi of right upper limb, including shoulder: Secondary | ICD-10-CM | POA: Diagnosis not present

## 2018-02-28 DIAGNOSIS — H5213 Myopia, bilateral: Secondary | ICD-10-CM | POA: Diagnosis not present

## 2018-04-26 ENCOUNTER — Encounter: Payer: Self-pay | Admitting: Family Medicine

## 2018-04-26 ENCOUNTER — Ambulatory Visit: Payer: 59 | Admitting: Family Medicine

## 2018-04-26 VITALS — BP 108/78 | HR 58 | Temp 98.2°F | Wt 228.8 lb

## 2018-04-26 DIAGNOSIS — J020 Streptococcal pharyngitis: Secondary | ICD-10-CM | POA: Diagnosis not present

## 2018-04-26 DIAGNOSIS — J029 Acute pharyngitis, unspecified: Secondary | ICD-10-CM | POA: Diagnosis not present

## 2018-04-26 LAB — POCT RAPID STREP A (OFFICE): RAPID STREP A SCREEN: POSITIVE — AB

## 2018-04-26 MED ORDER — AMOXICILLIN 500 MG PO CAPS: 500.0000 mg | ORAL_CAPSULE | Freq: Three times a day (TID) | ORAL | 0 refills | Status: DC

## 2018-04-26 NOTE — Patient Instructions (Signed)
Take the amoxicillin as directed  Drink lots of fluids  Rest when you can   Tylenol or ibuprofen for sore throat or fever   Update if not starting to improve in a week or if worsening

## 2018-04-26 NOTE — Progress Notes (Signed)
Subjective:    Patient ID: Kenneth Nunez, male    DOB: Nov 08, 1980, 37 y.o.   MRN: [id number]  HPI  37 yo pt of Dr Darnell Level here with a ST He is leaving for [place] in the am   Results for orders placed or performed in visit on 04/26/18  POCT rapid strep A  Result Value Ref Range   Rapid Strep A Screen Positive (A) Negative    Exposed to strep - practically whole family   ST for several days  Not severe but persistent   Temp: 98.2 F (36.8 C)   A little soreness in his shoulders No chills   No other symptoms No congestion or cough  No rash   Patient Active Problem List   Diagnosis Date Noted  . Strep throat 04/26/2018  . Hyperlipidemia 11/07/2017  . Asymmetrical left sensorineural hearing loss 11/07/2017  . Obesity, Class I, BMI 30-34.9 11/07/2017  . Healthcare maintenance 04/16/2013  . Extrinsic asthma   . Seasonal allergic rhinitis    Past Medical History:  Diagnosis Date  . Extrinsic asthma    mild, intermittent  . History of chicken pox   . Seasonal allergic rhinitis    Past Surgical History:  Procedure Laterality Date  . TYMPANOPLASTY  1983  . VASECTOMY  10/2017   Social History   Tobacco Use  . Smoking status: Never Smoker  . Smokeless tobacco: Former Network engineer Use Topics  . Alcohol use: Yes    Comment: rarely  . Drug use: No   Family History  Problem Relation Age of Onset  . Depression Mother   . Hypertension Mother   . Hyperlipidemia Father   . CAD Maternal Grandmother 62       MI  . Hyperlipidemia Paternal Grandmother   . Cancer Neg Hx   . Stroke Neg Hx    No Known Allergies Current Outpatient Medications on File Prior to Visit  Medication Sig Dispense Refill  . albuterol (PROVENTIL HFA;VENTOLIN HFA) 108 (90 Base) MCG/ACT inhaler Inhale 2 puffs into the lungs every 6 (six) hours as needed for wheezing. 1 Inhaler 3   No current facility-administered medications on file prior to visit.     Review of Systems  Constitutional:  Positive for fatigue. Negative for activity change, appetite change, fever and unexpected weight change.  HENT: Positive for sore throat. Negative for congestion, ear pain, facial swelling, mouth sores, postnasal drip, rhinorrhea, trouble swallowing and voice change.   Eyes: Negative for pain, redness, itching and visual disturbance.  Respiratory: Negative for cough, chest tightness, shortness of breath and wheezing.   Cardiovascular: Negative for chest pain and palpitations.  Gastrointestinal: Negative for abdominal pain, blood in stool, constipation, diarrhea and nausea.  Endocrine: Negative for cold intolerance, heat intolerance, polydipsia and polyuria.  Genitourinary: Negative for difficulty urinating, dysuria, frequency and urgency.  Musculoskeletal: Negative for arthralgias, joint swelling and myalgias.  Skin: Negative for pallor and rash.  Neurological: Negative for dizziness, tremors, weakness, numbness and headaches.  Hematological: Negative for adenopathy. Does not bruise/bleed easily.  Psychiatric/Behavioral: Negative for decreased concentration and dysphoric mood. The patient is not nervous/anxious.        Objective:   Physical Exam  Constitutional: He appears well-developed and well-nourished. He does not appear ill. No distress.  Well appearing  HENT:  Head: Normocephalic and atraumatic.  Right Ear: Tympanic membrane normal.  Left Ear: Tympanic membrane normal.  Mouth/Throat: Mucous membranes are normal. No oral lesions. No uvula swelling. Posterior  oropharyngeal erythema present. No oropharyngeal exudate or tonsillar abscesses. Tonsils are 1+ on the right. Tonsils are 1+ on the left. No tonsillar exudate.  Mild tonsillar edema and erythema   Eyes: Pupils are equal, round, and reactive to light. EOM are normal.  Neck: Normal range of motion. Neck supple.  Cardiovascular: Normal rate, regular rhythm and normal heart sounds.  Pulmonary/Chest: Breath sounds normal. No  respiratory distress. He has no wheezes. He has no rales.  Lymphadenopathy:    He has cervical adenopathy.  Skin: Skin is warm and dry. No rash noted.  Psychiatric: He has a normal mood and affect.          Assessment & Plan:   Problem List Items Addressed This Visit      Respiratory   Strep throat - Primary    With multiple exposures recently/ pos RST Cover with amoxicillin 500 mg tid Disc symptomatic care - see instructions on AVS  Fluids/rest/analgesice  Update if not starting to improve in a week or if worsening         Other Visit Diagnoses    Sore throat       Relevant Orders   POCT rapid strep A (Completed)

## 2018-04-28 NOTE — Assessment & Plan Note (Signed)
With multiple exposures recently/ pos RST Cover with amoxicillin 500 mg tid Disc symptomatic care - see instructions on AVS  Fluids/rest/analgesice  Update if not starting to improve in a week or if worsening

## 2018-04-29 ENCOUNTER — Ambulatory Visit: Payer: 59 | Admitting: Internal Medicine

## 2018-05-20 DIAGNOSIS — H6611 Chronic tubotympanic suppurative otitis media, right ear: Secondary | ICD-10-CM | POA: Diagnosis not present

## 2018-05-20 DIAGNOSIS — H722X1 Other marginal perforations of tympanic membrane, right ear: Secondary | ICD-10-CM | POA: Diagnosis not present

## 2018-05-20 DIAGNOSIS — H6122 Impacted cerumen, left ear: Secondary | ICD-10-CM | POA: Diagnosis not present

## 2019-01-20 DIAGNOSIS — H5213 Myopia, bilateral: Secondary | ICD-10-CM | POA: Diagnosis not present

## 2019-10-30 ENCOUNTER — Encounter: Payer: Self-pay | Admitting: Family Medicine

## 2019-10-30 LAB — BASIC METABOLIC PANEL
Creatinine: 1.1 (ref 0.6–1.3)
Glucose: 87
Potassium: 4.2 (ref 3.4–5.3)
Sodium: 138 (ref 137–147)

## 2019-10-30 LAB — LIPID PANEL
Cholesterol: 236 — AB (ref 0–200)
HDL: 64 (ref 35–70)
LDL Cholesterol: 152
Triglycerides: 92 (ref 40–160)

## 2019-10-30 LAB — HEPATIC FUNCTION PANEL
ALT: 31 (ref 10–40)
AST: 28 (ref 14–40)
Alkaline Phosphatase: 65 (ref 25–125)
Bilirubin, Total: 0.6

## 2019-10-30 LAB — CBC AND DIFFERENTIAL
Hemoglobin: 15.8 (ref 13.5–17.5)
Platelets: 225 (ref 150–399)
WBC: 5.7

## 2020-01-13 ENCOUNTER — Other Ambulatory Visit: Payer: Self-pay

## 2020-01-13 ENCOUNTER — Ambulatory Visit (INDEPENDENT_AMBULATORY_CARE_PROVIDER_SITE_OTHER): Payer: 59 | Admitting: Family Medicine

## 2020-01-13 ENCOUNTER — Encounter: Payer: Self-pay | Admitting: Family Medicine

## 2020-01-13 VITALS — BP 116/74 | HR 73 | Temp 97.9°F | Ht 72.0 in | Wt 227.1 lb

## 2020-01-13 DIAGNOSIS — J4520 Mild intermittent asthma, uncomplicated: Secondary | ICD-10-CM | POA: Diagnosis not present

## 2020-01-13 DIAGNOSIS — E669 Obesity, unspecified: Secondary | ICD-10-CM

## 2020-01-13 DIAGNOSIS — H9042 Sensorineural hearing loss, unilateral, left ear, with unrestricted hearing on the contralateral side: Secondary | ICD-10-CM | POA: Diagnosis not present

## 2020-01-13 DIAGNOSIS — IMO0001 Reserved for inherently not codable concepts without codable children: Secondary | ICD-10-CM

## 2020-01-13 DIAGNOSIS — R1013 Epigastric pain: Secondary | ICD-10-CM

## 2020-01-13 DIAGNOSIS — E7800 Pure hypercholesterolemia, unspecified: Secondary | ICD-10-CM | POA: Diagnosis not present

## 2020-01-13 DIAGNOSIS — E66811 Obesity, class 1: Secondary | ICD-10-CM

## 2020-01-13 DIAGNOSIS — Z0000 Encounter for general adult medical examination without abnormal findings: Secondary | ICD-10-CM | POA: Diagnosis not present

## 2020-01-13 MED ORDER — ALBUTEROL SULFATE HFA 108 (90 BASE) MCG/ACT IN AERS: 2.0000 | INHALATION_SPRAY | Freq: Four times a day (QID) | RESPIRATORY_TRACT | 6 refills | Status: DC | PRN

## 2020-01-13 NOTE — Assessment & Plan Note (Addendum)
Episode last week of acute epigastric pain that lasted 1 hour and resolved on its own. ?GI cause - gastritis vs cholelithiasis. Possible viral enteritis as son was sick shortly thereafter. Does not sound cardiac in nature. He already avoids greasy fried foods.  If this recurs, advised to let us know for further evaluation including labs, EKG, and possible abd [place].

## 2020-01-13 NOTE — Patient Instructions (Addendum)
Cholesterol is remaining elevated.  Work on low cholesterol diet.  Look into mediterranean diet.  You are doing well. Albuterol refilled.  Bring me a copy yearly of labs to monitor.  Let me know if abdominal pain recurs for further evaluation.  Return in 1 year for next physical.       Mediterranean Diet  Why follow it? Research shows. . Those who follow the Mediterranean diet have a reduced risk of heart disease  . The diet is associated with a reduced incidence of Parkinson's and Alzheimer's diseases . People following the diet may have longer life expectancies and lower rates of chronic diseases  . The Dietary Guidelines for Americans recommends the Mediterranean diet as an eating plan to promote health and prevent disease  What Is the Mediterranean Diet?  . Healthy eating plan based on typical foods and recipes of Mediterranean-style cooking . The diet is primarily a plant based diet; these foods should make up a majority of meals   Starches - Plant based foods should make up a majority of meals - They are an important sources of vitamins, minerals, energy, antioxidants, and fiber - Choose whole grains, foods high in fiber and minimally processed items  - Typical grain sources include wheat, oats, barley, corn, brown rice, bulgar, farro, millet, polenta, couscous  - Various types of beans include chickpeas, lentils, fava beans, black beans, white beans   Fruits  Veggies - Large quantities of antioxidant rich fruits & veggies; 6 or more servings  - Vegetables can be eaten raw or lightly drizzled with oil and cooked  - Vegetables common to the traditional Mediterranean Diet include: artichokes, arugula, beets, broccoli, brussel sprouts, cabbage, carrots, celery, collard greens, cucumbers, eggplant, kale, leeks, lemons, lettuce, mushrooms, okra, onions, peas, peppers, potatoes, pumpkin, radishes, rutabaga, shallots, spinach, sweet potatoes, turnips, zucchini - Fruits common to the  Mediterranean Diet include: apples, apricots, avocados, cherries, clementines, dates, figs, grapefruits, grapes, melons, nectarines, oranges, peaches, pears, pomegranates, strawberries, tangerines  Fats - Replace butter and margarine with healthy oils, such as olive oil, canola oil, and tahini  - Limit nuts to no more than a handful a day  - Nuts include walnuts, almonds, pecans, pistachios, pine nuts  - Limit or avoid candied, honey roasted or heavily salted nuts - Olives are central to the [employer] - can be eaten whole or used in a variety of dishes   Meats Protein - Limiting red meat: no more than a few times a month - When eating red meat: choose lean cuts and keep the portion to the size of deck of cards - Eggs: approx. 0 to 4 times a week  - Fish and lean poultry: at least 2 a week  - Healthy protein sources include, chicken, [place], lean beef, lamb - Increase intake of seafood such as tuna, salmon, trout, mackerel, shrimp, scallops - Avoid or limit high fat processed meats such as sausage and bacon  Dairy - Include moderate amounts of low fat dairy products  - Focus on healthy dairy such as fat free yogurt, skim milk, low or reduced fat cheese - Limit dairy products higher in fat such as whole or 2% milk, cheese, ice cream  Alcohol - Moderate amounts of red wine is ok  - No more than 5 oz daily for women (all ages) and men older than age 65  - No more than 10 oz of wine daily for men younger than 74  Other - Limit sweets and other desserts  -  Use herbs and spices instead of salt to flavor foods  - Herbs and spices common to the traditional Mediterranean Diet include: basil, bay leaves, chives, cloves, cumin, fennel, garlic, lavender, marjoram, mint, oregano, parsley, pepper, rosemary, sage, savory, sumac, tarragon, thyme   It's not just a diet, it's a lifestyle:  . The Mediterranean diet includes lifestyle factors typical of those in the region  . Foods, drinks and meals are  best eaten with others and savored . Daily physical activity is important for overall good health . This could be strenuous exercise like running and aerobics . This could also be more leisurely activities such as walking, housework, yard-work, or taking the stairs . Moderation is the key; a balanced and healthy diet accommodates most foods and drinks . Consider portion sizes and frequency of consumption of certain foods   Meal Ideas & Options:  . Breakfast:  o Whole wheat toast or whole wheat English muffins with peanut butter & hard boiled egg o Steel cut oats topped with apples & cinnamon and skim milk  o Fresh fruit: banana, strawberries, melon, berries, peaches  o Smoothies: strawberries, bananas, greek yogurt, peanut butter o Low fat greek yogurt with blueberries and granola  o Egg white omelet with spinach and mushrooms o Breakfast couscous: whole wheat couscous, apricots, skim milk, cranberries  . Sandwiches:  o Hummus and grilled vegetables (peppers, zucchini, squash) on whole wheat bread   o Grilled chicken on whole wheat pita with lettuce, tomatoes, cucumbers or tzatziki  o Tuna salad on whole wheat bread: tuna salad made with greek yogurt, olives, red peppers, capers, green onions o Garlic rosemary lamb pita: lamb sauted with garlic, rosemary, salt & pepper; add lettuce, cucumber, greek yogurt to pita - flavor with lemon juice and black pepper  . Seafood:  o Mediterranean grilled salmon, seasoned with garlic, basil, parsley, lemon juice and black pepper o Shrimp, lemon, and spinach whole-grain pasta salad made with low fat greek yogurt  o Seared scallops with lemon orzo  o Seared tuna steaks seasoned salt, pepper, coriander topped with tomato mixture of olives, tomatoes, olive oil, minced garlic, parsley, green onions and cappers  . Meats:  o Herbed greek chicken salad with kalamata olives, cucumber, feta  o Red bell peppers stuffed with spinach, bulgur, lean ground beef (or  lentils) & topped with feta   o Kebabs: skewers of chicken, tomatoes, onions, zucchini, squash  o [place] burgers: made with red onions, mint, dill, lemon juice, feta cheese topped with roasted red peppers . Vegetarian o Cucumber salad: cucumbers, artichoke hearts, celery, red onion, feta cheese, tossed in olive oil & lemon juice  o Hummus and whole grain pita points with a greek salad (lettuce, tomato, feta, olives, cucumbers, red onion) o Lentil soup with celery, carrots made with vegetable broth, garlic, salt and pepper  o Tabouli salad: parsley, bulgur, mint, scallions, cucumbers, tomato, radishes, lemon juice, olive oil, salt and pepper.

## 2020-01-13 NOTE — Assessment & Plan Note (Signed)
Overall stable. Encouraged regular exercise routine

## 2020-01-13 NOTE — Assessment & Plan Note (Signed)
Preventative protocols reviewed and updated unless pt declined. Discussed healthy diet and lifestyle.  

## 2020-01-13 NOTE — Progress Notes (Signed)
This visit was conducted in person.  BP 116/74 (BP Location: Left Arm, Patient Position: Sitting, Cuff Size: Large)   Pulse 73   Temp 97.9 F (36.6 C) (Temporal)   Ht 6' (1.829 m)   Wt 227 lb 2 oz (103 kg)   SpO2 97%   BMI 30.80 kg/m    CC: CPE Subjective:    Patient ID: Kenneth Nunez, male    DOB: 24-Apr-1981, 39 y.o.   MRN: [id number]  HPI: Kenneth Nunez is a 39 y.o. male presenting on 01/13/2020 for Annual Exam (Pt provided copy (to keep) of recent labs. 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   Woke up 1 wk ago with epigastric pain described as squeezing pain - this lasted about 1 hour then improved on its own, associated with nausea and some diaphoresis. Laying down worsened discomfort. Near vomiting without emesis. No bowel changes, heartburn, chest pain, dyspnea.. No significant greasy fried foods prior. No boring pain to the back. Pain not exertional. Son was sick a few days later - but from GI bug from school.   Work physical yearly - brings records including labs which were reviewed, as well as audiogram showing L mild hearing loss at low frequencies (chronic tinnitus, has seen [facility]NT, normal MRI) and spirometry showing moderate obstruction (FVC 93%, FEV1 72%, ratio 0.64)  Mild intermittent asthma - triggers are mowing lawn during pollen spring season. Rare albuterol use. No trouble at work.  Preventative: Flu shot yearly Td 2013, Tdap 11/2017  COVID vaccine - to consider.  Seat belt use discussed  Sunscreen use discussed. No changing moles on skin.  Non smoker Alcohol - 3-4 beers/wk Dentist - q6 mo Eye exam - yearly  Caffeine: 2-3 cups coffee/day Lives with wife, 3 children (2 daughters and 1 son) Occ: [place] [employer] Edu: bachelor's Activity: no regular exercise Diet: water, decaf tea or coffee, fruits/vegetables daily      Relevant past medical, surgical, family and social history reviewed and updated as indicated. Interim medical history since our last visit  reviewed. Allergies and medications reviewed and updated. Outpatient Medications Prior to Visit  Medication Sig Dispense Refill  . albuterol (PROVENTIL HFA;VENTOLIN HFA) 108 (90 Base) MCG/ACT inhaler Inhale 2 puffs into the lungs every 6 (six) hours as needed for wheezing. 1 Inhaler 3  . amoxicillin (AMOXIL) 500 MG capsule Take 1 capsule (500 mg total) by mouth 3 (three) times daily. Take with food 30 capsule 0   No facility-administered medications prior to visit.     Per HPI unless specifically indicated in ROS section below Review of Systems  Constitutional: Negative for activity change, appetite change, chills, fatigue, fever and unexpected weight change.  HENT: Positive for congestion, rhinorrhea and sneezing. Negative for hearing loss and postnasal drip.   Eyes: Negative for visual disturbance.  Respiratory: Negative for cough, chest tightness, shortness of breath and wheezing.   Cardiovascular: Negative for chest pain, palpitations and leg swelling.  Gastrointestinal: Positive for abdominal pain (see HPI) and nausea (see HPI). Negative for abdominal distention, blood in stool, constipation, diarrhea and vomiting.  Genitourinary: Negative for difficulty urinating and hematuria.  Musculoskeletal: Negative for arthralgias, myalgias and neck pain.  Skin: Negative for rash.  Neurological: Negative for dizziness, seizures, syncope and headaches.  Hematological: Negative for adenopathy. Does not bruise/bleed easily.  Psychiatric/Behavioral: Negative for dysphoric mood. The patient is not nervous/anxious.    Objective:  BP 116/74 (BP Location: Left Arm, Patient Position: Sitting, Cuff Size: Large)   Pulse  73   Temp 97.9 F (36.6 C) (Temporal)   Ht 6' (1.829 m)   Wt 227 lb 2 oz (103 kg)   SpO2 97%   BMI 30.80 kg/m   Wt Readings from Last 3 Encounters:  01/13/20 227 lb 2 oz (103 kg)  04/26/18 228 lb 12.8 oz (103.8 kg)  11/07/17 234 lb 8 oz (106.4 kg)      Physical Exam Vitals  and nursing note reviewed.  Constitutional:      General: He is not in acute distress.    Appearance: He is well-developed.  HENT:     Head: Normocephalic and atraumatic.     Right Ear: Hearing, tympanic membrane, ear canal and external ear normal.     Left Ear: Hearing, tympanic membrane, ear canal and external ear normal.     Nose: Nose normal.     Mouth/Throat:     Pharynx: Uvula midline. No oropharyngeal exudate or posterior oropharyngeal erythema.  Eyes:     General: No scleral icterus.    Conjunctiva/sclera: Conjunctivae normal.     Pupils: Pupils are equal, round, and reactive to light.  Cardiovascular:     Rate and Rhythm: Normal rate and regular rhythm.     Pulses:          Radial pulses are 2+ on the right side and 2+ on the left side.     Heart sounds: Normal heart sounds. No murmur.  Pulmonary:     Effort: Pulmonary effort is normal. No respiratory distress.     Breath sounds: Normal breath sounds. No wheezing or rales.  Abdominal:     General: Bowel sounds are normal. There is no distension.     Palpations: Abdomen is soft. There is no mass.     Tenderness: There is no abdominal tenderness. There is no guarding or rebound.  Musculoskeletal:        General: Normal range of motion.     Cervical back: Normal range of motion and neck supple.  Lymphadenopathy:     Cervical: No cervical adenopathy.  Skin:    General: Skin is warm and dry.     Findings: No rash.  Neurological:     Mental Status: He is alert and oriented to person, place, and time.     Comments: CN grossly intact, station and gait intact  Psychiatric:        Behavior: Behavior normal.        Thought Content: Thought content normal.        Judgment: Judgment normal.       Assessment & Plan:  This visit occurred during the SARS-CoV-2 public health emergency.  Safety protocols were in place, including screening questions prior to the visit, additional usage of staff PPE, and extensive cleaning of exam  room while observing appropriate contact time as indicated for disinfecting solutions.   Problem List Items Addressed This Visit    Obesity, Class I, BMI 30-34.9    Overall stable. Encouraged regular exercise routine       Hyperlipidemia    No known fmhx premature CAD, no strong indicatin for statin at this time. Consider checking lipoprotein a. For now, work on low chol diet. Mediterranean diet handout provided today.  The ASCVD Risk score Mikey Bussing DC Jr., et al., 2013) failed to calculate for the following reasons:   The 2013 ASCVD risk score is only valid for ages 67 to 3       Healthcare maintenance - Primary  Preventative protocols reviewed and updated unless pt declined. Discussed healthy diet and lifestyle.       Extrinsic asthma    Stable period on PRN albuterol. Continue.       Relevant Medications   albuterol (VENTOLIN HFA) 108 (90 Base) MCG/ACT inhaler   Epigastric pain    Episode last week of acute epigastric pain that lasted 1 hour and resolved on its own. ?GI cause - gastritis vs cholelithiasis. Possible viral enteritis as son was sick shortly thereafter. Does not sound cardiac in nature. He already avoids greasy fried foods.  If this recurs, advised to let us know for further evaluation including labs, EKG, and possible abd [place].       Asymmetrical left sensorineural hearing loss       Meds ordered this encounter  Medications  . albuterol (VENTOLIN HFA) 108 (90 Base) MCG/ACT inhaler    Sig: Inhale 2 puffs into the lungs every 6 (six) hours as needed for wheezing.    Dispense:  18 g    Refill:  6   No orders of the defined types were placed in this encounter.   Patient instructions: Cholesterol is remaining elevated.  Work on low cholesterol diet.  Look into mediterranean diet.  You are doing well. Albuterol refilled.  Bring me a copy yearly of labs to monitor.  Let me know if abdominal pain recurs for further evaluation.  Return in 1 year for next  physical.   Follow up plan: Return in about 1 year (around 01/12/2021) for annual exam, prior fasting for blood work.  Ria Bush, MD

## 2020-01-13 NOTE — Assessment & Plan Note (Signed)
Stable period on PRN albuterol. Continue.

## 2020-01-13 NOTE — Assessment & Plan Note (Addendum)
No known fmhx premature CAD, no strong indicatin for statin at this time. Consider checking lipoprotein a. For now, work on low chol diet. Mediterranean diet handout provided today.  The ASCVD Risk score Mikey Bussing DC Jr., et al., 2013) failed to calculate for the following reasons:   The 2013 ASCVD risk score is only valid for ages 60 to 31

## 2020-09-18 ENCOUNTER — Encounter (HOSPITAL_COMMUNITY): Payer: Self-pay

## 2020-09-18 ENCOUNTER — Other Ambulatory Visit: Payer: Self-pay

## 2020-09-18 ENCOUNTER — Ambulatory Visit (HOSPITAL_COMMUNITY)
Admission: EM | Admit: 2020-09-18 | Discharge: 2020-09-18 | Disposition: A | Discharge status: Home / Self Care | Payer: Worker's Compensation | Attending: Student | Admitting: Student

## 2020-09-18 ENCOUNTER — Ambulatory Visit (INDEPENDENT_AMBULATORY_CARE_PROVIDER_SITE_OTHER): Payer: Worker's Compensation

## 2020-09-18 DIAGNOSIS — M25521 Pain in right elbow: Secondary | ICD-10-CM | POA: Diagnosis not present

## 2020-09-18 DIAGNOSIS — M79601 Pain in right arm: Secondary | ICD-10-CM

## 2020-09-18 MED ORDER — METAXALONE 800 MG PO TABS: 800.0000 mg | ORAL_TABLET | Freq: Three times a day (TID) | ORAL | 0 refills | Status: DC

## 2020-09-18 NOTE — ED Provider Notes (Signed)
[facility]    CSN: [id number] Arrival date & time: 09/18/20  1011      History   Chief Complaint Chief Complaint  Patient presents with  . Arm Pain    This am    HPI Kenneth Nunez is a 40 y.o. male presenting with R arm pain for 4 hours. He is right handed. 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 He pulled an object with his right arm, and heart a pop and immediately felt pain on his right forearm. He can still move it, but endorses forearm pain with flexion of the elbow and with pronation, and with writing. States sensation feels normal. Denies other injuries, denies elbow pain, denies shoulder pain. Feeling well otherwise.   HPI  Past Medical History:  Diagnosis Date  . Extrinsic asthma    mild, intermittent  . History of chicken pox   . Seasonal allergic rhinitis     Patient Active Problem List   Diagnosis Date Noted  . Epigastric pain 01/13/2020  . Hyperlipidemia 11/07/2017  . Asymmetrical left sensorineural hearing loss 11/07/2017  . Obesity, Class I, BMI 30-34.9 11/07/2017  . Healthcare maintenance 04/16/2013  . Extrinsic asthma   . Seasonal allergic rhinitis     Past Surgical History:  Procedure Laterality Date  . TYMPANOPLASTY  1983  . VASECTOMY  10/2017       Home Medications    Prior to Admission medications   Medication Sig Start Date End Date Taking? Authorizing Provider  albuterol (VENTOLIN HFA) 108 (90 Base) MCG/ACT inhaler Inhale 2 puffs into the lungs every 6 (six) hours as needed for wheezing. 01/13/20  Yes Ria Bush, MD  metaxalone (SKELAXIN) 800 MG tablet Take 1 tablet (800 mg total) by mouth 3 (three) times daily. 09/18/20  Yes Hazel Sams, PA-C    Family History Family History  Problem Relation Age of Onset  . Depression Mother   . Hypertension Mother   . Hyperlipidemia Father   . CAD Maternal Grandmother 62       MI  . Hyperlipidemia Paternal Grandmother   . Cancer Neg Hx    . Stroke Neg Hx     Social History Social History   Tobacco Use  . Smoking status: Never Smoker  . Smokeless tobacco: Former Network engineer  . Vaping Use: Never used  Substance Use Topics  . Alcohol use: Yes    Comment: rarely  . Drug use: No     Allergies   Patient has no known allergies.   Review of Systems Review of Systems  Musculoskeletal:       Right forearm pain  All other systems reviewed and are negative.    Physical Exam Triage Vital Signs ED Triage Vitals  Enc Vitals Group     BP 09/18/20 1035 (!) 141/84     Pulse Rate 09/18/20 1035 79     Resp 09/18/20 1035 17     Temp 09/18/20 1037 98 F (36.7 C)     Temp Source 09/18/20 1035 Oral     SpO2 09/18/20 1035 100 %     Weight --      Height --      Head Circumference --      Peak Flow --      Pain Score 09/18/20 1038 0     Pain Loc --      Pain Edu? --      Excl. in  GC? --    No data found.  Updated Vital Signs BP (!) 141/84 (BP Location: Left Arm)   Pulse 79   Temp 98 F (36.7 C) (Oral)   Resp 17   SpO2 100%   Visual Acuity Right Eye Distance:   Left Eye Distance:   Bilateral Distance:    Right Eye Near:   Left Eye Near:    Bilateral Near:     Physical Exam Vitals reviewed.  Constitutional:      Appearance: Normal appearance.  Cardiovascular:     Rate and Rhythm: Normal rate and regular rhythm.     Heart sounds: Normal heart sounds.  Pulmonary:     Effort: Pulmonary effort is normal.     Breath sounds: Normal breath sounds.  Musculoskeletal:     Right upper arm: Normal. No swelling, deformity, lacerations, tenderness or bony tenderness.     Left upper arm: Normal. No swelling, deformity, lacerations, tenderness or bony tenderness.     Right elbow: Normal. No swelling, deformity, effusion or lacerations. Normal range of motion. No tenderness.     Left elbow: Normal. No swelling, deformity, effusion or lacerations. Normal range of motion. No tenderness.     Right forearm:  Tenderness present. No swelling, edema, deformity or bony tenderness.     Left forearm: Normal. No swelling, edema, deformity, lacerations or tenderness.     Comments: R proximal forearm with tenderness to palpation. Pain elicited by pronation against resistance and with flexion of elbow. Strength 5/5. Grip strength 5/5. Neurovascularly intact.   Neurological:     General: No focal deficit present.     Mental Status: He is alert and oriented to person, place, and time.     Comments: R elbow/forearm/hand neurovascularly intact  Psychiatric:        Mood and Affect: Mood normal.        Behavior: Behavior normal.        Thought Content: Thought content normal.        Judgment: Judgment normal.      UC Treatments / Results  Labs (all labs ordered are listed, but only abnormal results are displayed) Labs Reviewed - No data to display  EKG   Radiology DG Elbow Complete Right  Result Date: 09/18/2020 CLINICAL DATA:  Right elbow pain after injury at work today. EXAM: RIGHT ELBOW - COMPLETE 3+ VIEW COMPARISON:  None. FINDINGS: There is no evidence of fracture, dislocation, or joint effusion. There is no evidence of arthropathy or other focal bone abnormality. Soft tissues are unremarkable. IMPRESSION: Negative. Electronically Signed   By: Marijo Conception M.D.   On: 09/18/2020 12:10    Procedures Procedures (including critical care time)  Medications Ordered in UC Medications - No data to display  Initial Impression / Assessment and Plan / UC Course  I have reviewed the triage vital signs and the nursing notes.  Pertinent labs & imaging results that were available during my care of the patient were reviewed by me and considered in my medical decision making (see chart for details).     Xray R elbow/forearm: no bony abnormality. Tylenol/ibuprofen, metaxalone as below.  ROM exercises, light duty at work. F/u with ortho if symptoms worsen/persist in 5 days. Information provided.    Final Clinical Impressions(s) / UC Diagnoses   Final diagnoses:  Right arm pain     Discharge Instructions     -Follow-up with orthopedist if symptoms worsen/persist past 5 days (information provided below) -You can also walk  in to [facility] for acute orthopedic complaints. They're an orthopedic urgent care.  -Try the muscle relaxer (Metaxalone) up to 3x daily for muscular pain. This can make you drowsy, so try taking before bed.  -Tylenol/ibuprofen     ED Prescriptions    Medication Sig Dispense Auth. Provider   metaxalone (SKELAXIN) 800 MG tablet Take 1 tablet (800 mg total) by mouth 3 (three) times daily. 21 tablet Hazel Sams, PA-C     PDMP not reviewed this encounter.   Hazel Sams, PA-C 09/18/20 1228

## 2020-09-18 NOTE — Discharge Instructions (Addendum)
-  Follow-up with orthopedist if symptoms worsen/persist past 5 days (information provided below) -You can also walk in to [facility] for acute orthopedic complaints. They're an orthopedic urgent care.  -Try the muscle relaxer (Metaxalone) up to 3x daily for muscular pain. This can make you drowsy, so try taking before bed.  -Tylenol/ibuprofen
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
## 2020-12-15 ENCOUNTER — Ambulatory Visit: Payer: 59 | Admitting: Family Medicine

## 2020-12-15 ENCOUNTER — Other Ambulatory Visit: Payer: Self-pay

## 2020-12-15 ENCOUNTER — Encounter: Payer: Self-pay | Admitting: Family Medicine

## 2020-12-15 DIAGNOSIS — L0231 Cutaneous abscess of buttock: Secondary | ICD-10-CM | POA: Diagnosis not present

## 2020-12-15 DIAGNOSIS — L03317 Cellulitis of buttock: Secondary | ICD-10-CM | POA: Insufficient documentation

## 2020-12-15 MED ORDER — DOXYCYCLINE HYCLATE 100 MG PO TABS: 100.0000 mg | ORAL_TABLET | Freq: Two times a day (BID) | ORAL | 0 refills | Status: DC

## 2020-12-15 NOTE — Progress Notes (Signed)
Patient ID: Kenneth Nunez, male    DOB: [birth year], 40 y.o.   MRN: [id number]  This visit was conducted in person.  BP 122/76   Pulse 68   Temp 98 F (36.7 C) (Temporal)   Ht 6' (1.829 m)   Wt 231 lb 5 oz (104.9 kg)   SpO2 97%   BMI 31.37 kg/m    CC: lower back skin mass  Subjective:   HPI: Kenneth Nunez is a 40 y.o. male presenting on 12/15/2020 for Mass (C/o bump on lower back at top of tailbone.  Noticed about 1 wk ago.  Area is painful and pt has h/o bump in same area. )   1 wk h/o knot on tailbone. Today noticed some redness. Area has been irritated in the past but never persisted past a few days.  Denies inciting trauma/injury or fall.  Tried heat to this without relief.  No draining. No fevers/chills.  No other rash.      Relevant past medical, surgical, family and social history reviewed and updated as indicated. Interim medical history since our last visit reviewed. Allergies and medications reviewed and updated. Outpatient Medications Prior to Visit  Medication Sig Dispense Refill  . albuterol (VENTOLIN HFA) 108 (90 Base) MCG/ACT inhaler Inhale 2 puffs into the lungs every 6 (six) hours as needed for wheezing. 18 g 6  . metaxalone (SKELAXIN) 800 MG tablet Take 1 tablet (800 mg total) by mouth 3 (three) times daily. 21 tablet 0   No facility-administered medications prior to visit.     Per HPI unless specifically indicated in ROS section below Review of Systems Objective:  BP 122/76   Pulse 68   Temp 98 F (36.7 C) (Temporal)   Ht 6' (1.829 m)   Wt 231 lb 5 oz (104.9 kg)   SpO2 97%   BMI 31.37 kg/m   Wt Readings from Last 3 Encounters:  12/15/20 231 lb 5 oz (104.9 kg)  01/13/20 227 lb 2 oz (103 kg)  04/26/18 228 lb 12.8 oz (103.8 kg)      Physical Exam Vitals and nursing note reviewed.  Constitutional:      Appearance: Normal appearance. He is not ill-appearing.  Skin:    General: Skin is warm and dry.     Findings: Erythema present.  No rash.          Comments: Induration with mild erythema without fluctuance at R tailbone just above buttock crease  Neurological:     Mental Status: He is alert.  Psychiatric:        Mood and Affect: Mood normal.        Behavior: Behavior normal.       Assessment & Plan:  This visit occurred during the SARS-CoV-2 public health emergency.  Safety protocols were in place, including screening questions prior to the visit, additional usage of staff PPE, and extensive cleaning of exam room while observing appropriate contact time as indicated for disinfecting solutions.   Problem List Items Addressed This Visit    Cellulitis and abscess of buttock    Cellulitis possible developing abscess to R upper buttock. No fluctuance, nothing to drain at this time. Discussed possible pilonidal cyst given prior history. Will treat with doxycycline 10d course, with photosensitivity precautions - he will be extra careful with upcoming trip to the beach. Reviewed red flags to necessitate urgent eval over the next week. He agrees with plan.  Meds ordered this encounter  Medications  . doxycycline (VIBRA-TABS) 100 MG tablet    Sig: Take 1 tablet (100 mg total) by mouth 2 (two) times daily.    Dispense:  20 tablet    Refill:  0   No orders of the defined types were placed in this encounter.   Patient Instructions  Possible developing skin infection - treat with doxycycline twice daily for 10 days. Caution with sun exposure while on this medicine.  Do warm compresses to area 2-3 times a day if able.  If area coming to a head or fevers/chills, seek urgent care over the weekend.  Let us know if not improving with treatment.    Follow up plan: Return if symptoms worsen or fail to improve.  Ria Bush, MD

## 2020-12-15 NOTE — Assessment & Plan Note (Signed)
Cellulitis possible developing abscess to R upper buttock. No fluctuance, nothing to drain at this time. Discussed possible pilonidal cyst given prior history. Will treat with doxycycline 10d course, with photosensitivity precautions - he will be extra careful with upcoming trip to the beach. Reviewed red flags to necessitate urgent eval over the next week. He agrees with plan.

## 2020-12-15 NOTE — Patient Instructions (Addendum)
Possible developing skin infection - treat with doxycycline twice daily for 10 days. Caution with sun exposure while on this medicine.  Do warm compresses to area 2-3 times a day if able.  If area coming to a head or fevers/chills, seek urgent care over the weekend.  Let us know if not improving with treatment.

## 2021-07-27 IMAGING — DX DG ELBOW COMPLETE 3+V*R*
4 series · 4 of 4 positions shown · non-contrast
Comparison: None.

CLINICAL DATA: Right elbow pain after injury at work today.

EXAM:
RIGHT ELBOW - COMPLETE 3+ VIEW

[elbow ap]
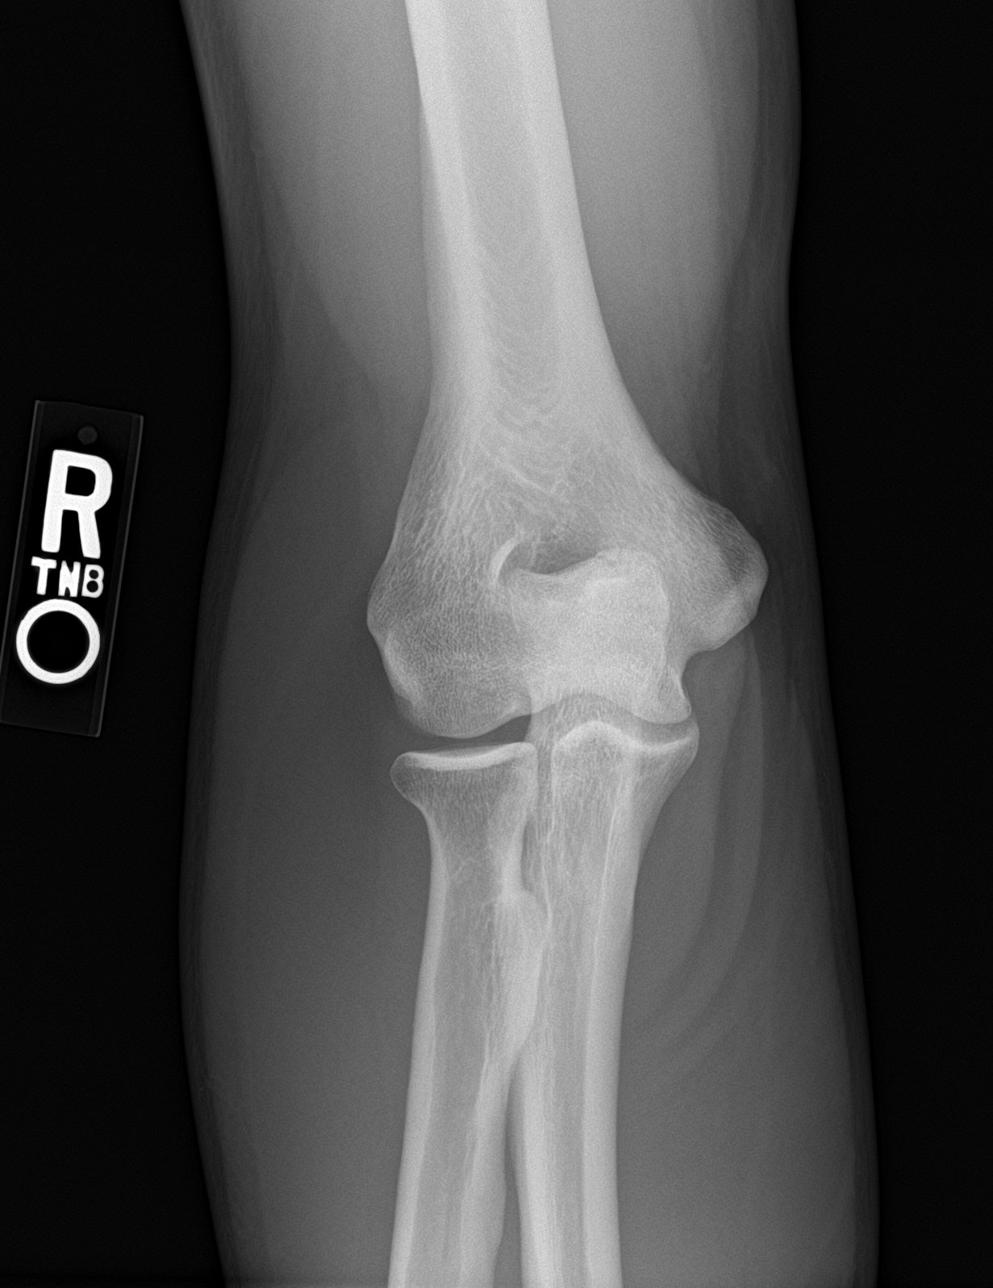

[elbow obl (1 of 2)]
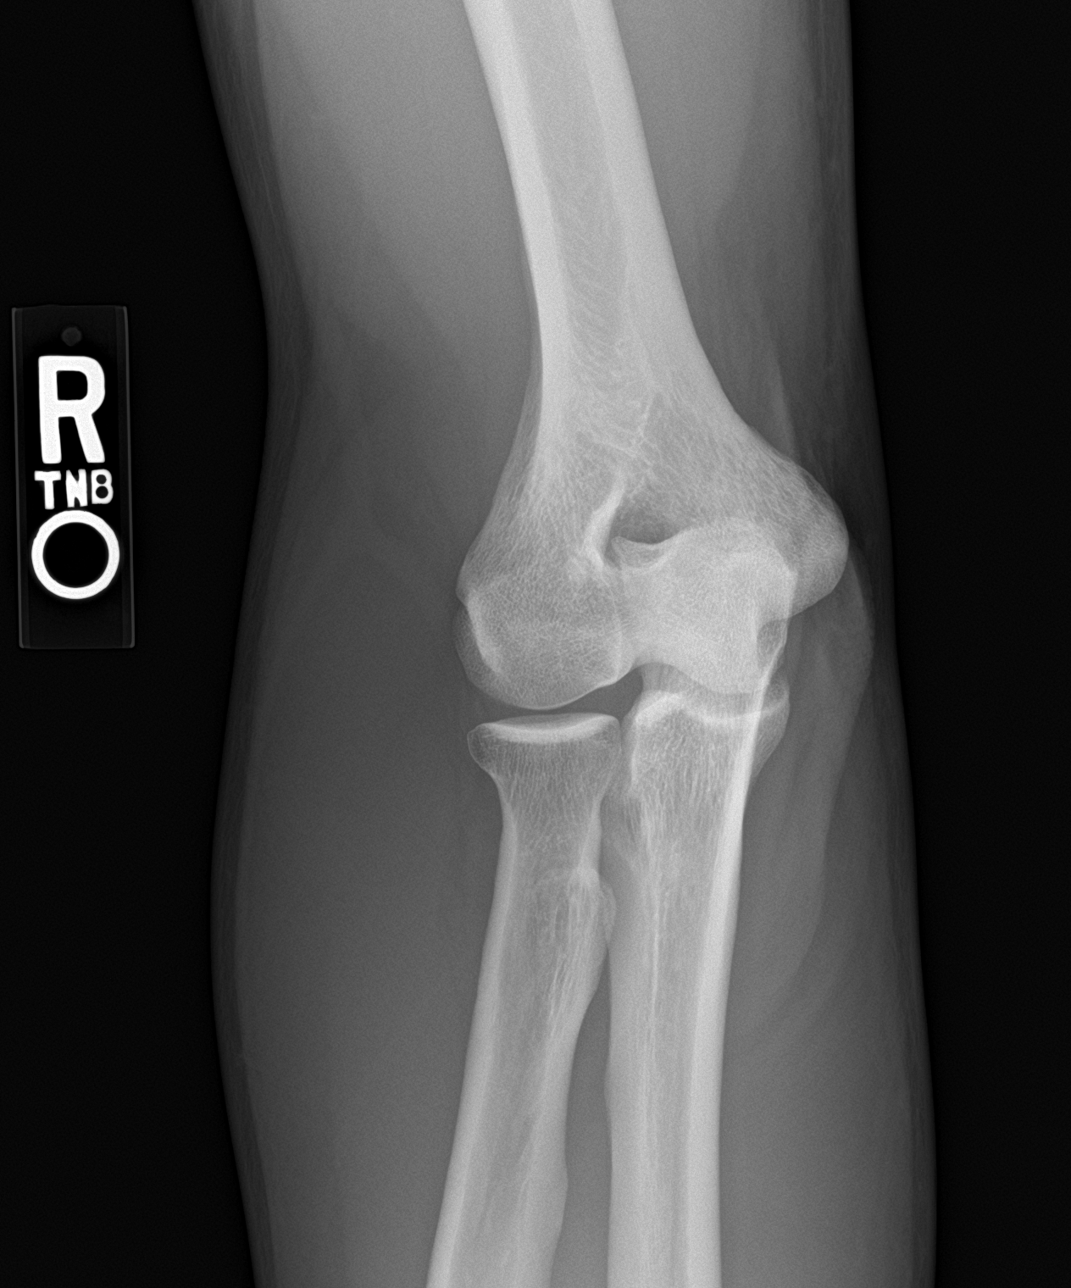

[elbow obl (2 of 2)]
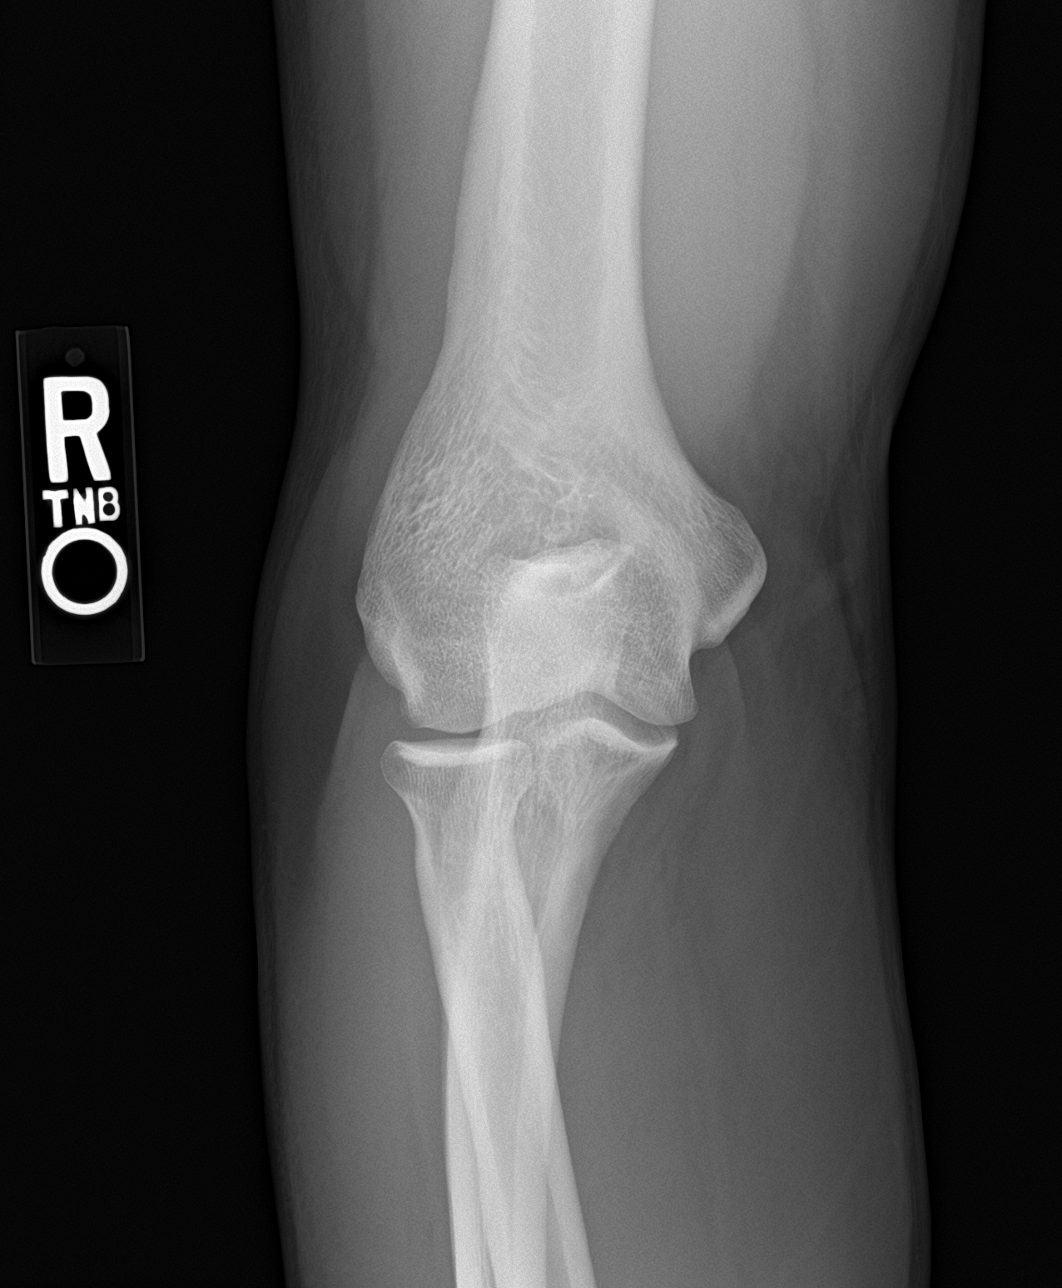

[elbow lat]
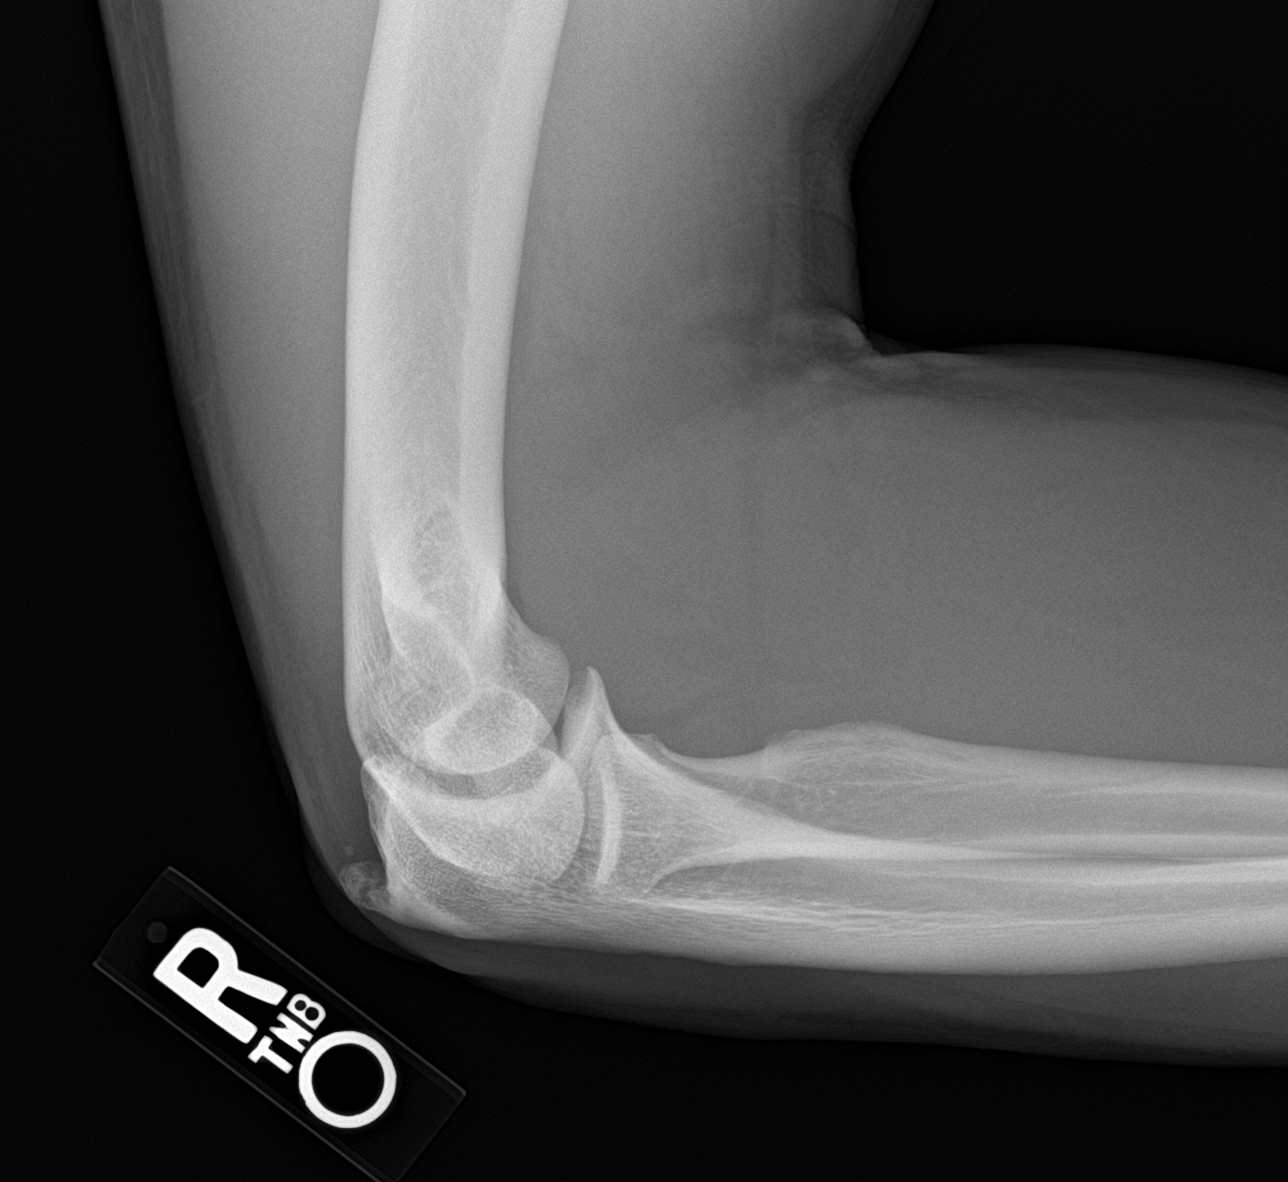

[4 of 4 positions shown; findings below may reference images not displayed]

FINDINGS: There is no evidence of fracture, dislocation, or joint effusion.
There is no evidence of arthropathy or other focal bone abnormality.
Soft tissues are unremarkable.
IMPRESSION: Negative.

## 2021-12-21 ENCOUNTER — Ambulatory Visit: Payer: 59 | Admitting: Family Medicine

## 2021-12-21 ENCOUNTER — Encounter: Payer: Self-pay | Admitting: Family Medicine

## 2021-12-21 VITALS — BP 120/82 | HR 53 | Temp 97.9°F | Ht 72.0 in | Wt 232.4 lb

## 2021-12-21 DIAGNOSIS — G5712 Meralgia paresthetica, left lower limb: Secondary | ICD-10-CM

## 2021-12-21 LAB — VITAMIN B12: Vitamin B-12: 360 pg/mL (ref 211–911)

## 2021-12-21 LAB — TSH: TSH: 1.57 u[IU]/mL (ref 0.35–5.50)

## 2021-12-21 NOTE — Patient Instructions (Addendum)
You are describing possible meralgia paresthetica - see handout provided.  ?May use tylenol or ibuprofen for discomfort.  ?If burning pain or symptoms worsening, let us know to consider specific nerve pain medicine (gabapentin) or referral for possible nerve injection.  ?Labs today.  ?

## 2021-12-21 NOTE — Assessment & Plan Note (Signed)
Story/exam consistent with left sided meralgia paresthetica. Reviewed dx and possible etiologies with patient. Supportive measures recommended - avoid tight fitting clothing, may use tylenol/ibuprofen PRN. Update if worsening symptoms to consider gabapentin and/or referral to PM&R consider steroid injection.  ?Check TSH, B12.  ?

## 2021-12-21 NOTE — Progress Notes (Signed)
? ? Patient ID: [name], male    DOB: Dec 19, 1980, 41 y.o.   MRN: [id number] ? ?This visit was conducted in person. ? ?BP 120/82   Pulse (!) 53   Temp 97.9 ?F (36.6 ?C) (Temporal)   Ht 6' (1.829 m)   Wt 232 lb 6 oz (105.4 kg)   SpO2 99%   BMI 31.52 kg/m?   ? ?CC: left pain soreness/numbness  ?Subjective:  ? ?HPI: ?HAGOP MCCOLLAM is a 41 y.o. male presenting on 12/21/2021 for Leg Pain (C/o L outer upper leg soreness to touch. Has also had some numbness when lying on L side. Started about 1 mo ago. ) ? ? ?1 mo h/o L leg numbness/tingling as well as soreness, worse with sitting in recliner or laying on back. Occasional burning pain noted as well.  ? ?No rash, fevers/chills.  ?No back pain. Denies inciting trauma/injury or falls.  ?Hasn't tried medicines for this.  ?Doesn't wear tight pants.  ?   ? ?Relevant past medical, surgical, family and social history reviewed and updated as indicated. Interim medical history since our last visit reviewed. ?Allergies and medications reviewed and updated. ?Outpatient Medications Prior to Visit  ?Medication Sig Dispense Refill  ? albuterol (VENTOLIN HFA) 108 (90 Base) MCG/ACT inhaler Inhale 2 puffs into the lungs every 6 (six) hours as needed for wheezing. 18 g 6  ? doxycycline (VIBRA-TABS) 100 MG tablet Take 1 tablet (100 mg total) by mouth 2 (two) times daily. 20 tablet 0  ? ?No facility-administered medications prior to visit.  ?  ? ?Per HPI unless specifically indicated in ROS section below ?Review of Systems ? ?Objective:  ?BP 120/82   Pulse (!) 53   Temp 97.9 ?F (36.6 ?C) (Temporal)   Ht 6' (1.829 m)   Wt 232 lb 6 oz (105.4 kg)   SpO2 99%   BMI 31.52 kg/m?   ?Wt Readings from Last 3 Encounters:  ?12/21/21 232 lb 6 oz (105.4 kg)  ?12/15/20 231 lb 5 oz (104.9 kg)  ?01/13/20 227 lb 2 oz (103 kg)  ?  ?  ?Physical Exam ?Vitals and nursing note reviewed.  ?Constitutional:   ?   Appearance: Normal appearance. He is not ill-appearing.  ?Musculoskeletal:     ?    General: Normal range of motion.  ?   Right lower leg: No edema.  ?   Left lower leg: No edema.  ?   Comments:  ?No pain midline spine ?No paraspinous mm tenderness ?Neg SLR bilaterally. ?No pain with int/ext rotation at hip. ?Neg FABER. ?No pain at SIJ, GTB or sciatic notch bilaterally.  ?Discomfort to palpation of left lateral thigh without mass or skin changes  ?Skin: ?   General: Skin is warm and dry.  ?   Findings: No erythema or rash.  ?Neurological:  ?   Mental Status: He is alert.  ?   Sensory: Sensation is intact.  ?   Motor: Motor function is intact.  ?   Coordination: Coordination is intact.  ?   Deep Tendon Reflexes:  ?   Reflex Scores: ?     Patellar reflexes are 2+ on the right side and 2+ on the left side. ?     Achilles reflexes are 2+ on the right side and 2+ on the left side. ?   Comments: Diminished sensation to light touch left lateral thigh   ?Psychiatric:     ?   Mood and Affect: Mood normal.     ?  Behavior: Behavior normal.  ? ?   ?Results for orders placed or performed in visit on 01/13/20  ?CBC and differential  ?Result Value Ref Range  ? Hemoglobin 15.8 13.5 - 17.5  ? Platelets 225 150 - 399  ? WBC 5.7   ?Basic metabolic panel  ?Result Value Ref Range  ? Glucose 87   ? Creatinine 1.1 0.6 - 1.3  ? Potassium 4.2 3.4 - 5.3  ? Sodium 138 137 - 147  ?Lipid panel  ?Result Value Ref Range  ? Triglycerides 92 40 - 160  ? Cholesterol 236 (A) 0 - 200  ? HDL 64 35 - 70  ? LDL Cholesterol 152   ?Hepatic function panel  ?Result Value Ref Range  ? Alkaline Phosphatase 65 25 - 125  ? ALT 31 10 - 40  ? AST 28 14 - 40  ? Bilirubin, Total 0.6   ? ? ?Assessment & Plan:  ? ?Problem List Items Addressed This Visit   ? ? Meralgia paresthetica of left side - Primary  ?  Story/exam consistent with left sided meralgia paresthetica. Reviewed dx and possible etiologies with patient. Supportive measures recommended - avoid tight fitting clothing, may use tylenol/ibuprofen PRN. Update if worsening symptoms to  consider gabapentin and/or referral to PM&R consider steroid injection.  ?Check TSH, B12.  ? ?  ?  ? Relevant Orders  ? TSH  ? Vitamin B12  ?  ? ?No orders of the defined types were placed in this encounter. ? ?Orders Placed This Encounter  ?Procedures  ? TSH  ? Vitamin B12  ? ? ? ?Patient Instructions  ?You are describing possible meralgia paresthetica - see handout provided.  ?May use tylenol or ibuprofen for discomfort.  ?If burning pain or symptoms worsening, let us know to consider specific nerve pain medicine (gabapentin) or referral for possible nerve injection.  ?Labs today.  ? ?Follow up plan: ?Return if symptoms worsen or fail to improve. ? ?Ria Bush, MD   ?

## 2022-03-06 ENCOUNTER — Encounter: Payer: Self-pay | Admitting: Family Medicine

## 2022-03-06 ENCOUNTER — Ambulatory Visit (INDEPENDENT_AMBULATORY_CARE_PROVIDER_SITE_OTHER): Payer: 59 | Admitting: Family Medicine

## 2022-03-06 VITALS — BP 118/70 | HR 86 | Temp 97.9°F | Ht 71.5 in | Wt 226.0 lb

## 2022-03-06 DIAGNOSIS — Z0000 Encounter for general adult medical examination without abnormal findings: Secondary | ICD-10-CM | POA: Diagnosis not present

## 2022-03-06 DIAGNOSIS — G8929 Other chronic pain: Secondary | ICD-10-CM

## 2022-03-06 DIAGNOSIS — J302 Other seasonal allergic rhinitis: Secondary | ICD-10-CM | POA: Diagnosis not present

## 2022-03-06 DIAGNOSIS — E7800 Pure hypercholesterolemia, unspecified: Secondary | ICD-10-CM | POA: Diagnosis not present

## 2022-03-06 DIAGNOSIS — M25511 Pain in right shoulder: Secondary | ICD-10-CM | POA: Insufficient documentation

## 2022-03-06 DIAGNOSIS — J4520 Mild intermittent asthma, uncomplicated: Secondary | ICD-10-CM

## 2022-03-06 MED ORDER — ALBUTEROL SULFATE HFA 108 (90 BASE) MCG/ACT IN AERS: 2.0000 | INHALATION_SPRAY | Freq: Four times a day (QID) | RESPIRATORY_TRACT | 6 refills | Status: AC | PRN

## 2022-03-06 NOTE — Patient Instructions (Signed)
You are doing well today Try exercises provided for rotator cuff - likely R RTC injury If ongoing, return to see sports medicine Dr Lorelei Pont.  Good to see you today Return as needed or in 1 year for physical.   Health Maintenance, Male Adopting a healthy lifestyle and getting preventive care are important in promoting health and wellness. Ask your health care provider about: The right schedule for you to have regular tests and exams. Things you can do on your own to prevent diseases and keep yourself healthy. What should I know about diet, weight, and exercise? Eat a healthy diet  Eat a diet that includes plenty of vegetables, fruits, low-fat dairy products, and lean protein. Do not eat a lot of foods that are high in solid fats, added sugars, or sodium. Maintain a healthy weight Body mass index (BMI) is a measurement that can be used to identify possible weight problems. It estimates body fat based on height and weight. Your health care provider can help determine your BMI and help you achieve or maintain a healthy weight. Get regular exercise Get regular exercise. This is one of the most important things you can do for your health. Most adults should: Exercise for at least 150 minutes each week. The exercise should increase your heart rate and make you sweat (moderate-intensity exercise). Do strengthening exercises at least twice a week. This is in addition to the moderate-intensity exercise. Spend less time sitting. Even light physical activity can be beneficial. Watch cholesterol and blood lipids Have your blood tested for lipids and cholesterol at 40 years of age, then have this test every 5 years. You may need to have your cholesterol levels checked more often if: Your lipid or cholesterol levels are high. You are older than 40 years of age. You are at high risk for heart disease. What should I know about cancer screening? Many types of cancers can be detected early and may often be  prevented. Depending on your health history and family history, you may need to have cancer screening at various ages. This may include screening for: Colorectal cancer. Prostate cancer. Skin cancer. Lung cancer. What should I know about heart disease, diabetes, and high blood pressure? Blood pressure and heart disease High blood pressure causes heart disease and increases the risk of stroke. This is more likely to develop in people who have high blood pressure readings or are overweight. Talk with your health care provider about your target blood pressure readings. Have your blood pressure checked: Every 3-5 years if you are 61-36 years of age. Every year if you are 33 years old or older. If you are between the ages of 74 and 59 and are a current or former smoker, ask your health care provider if you should have a one-time screening for abdominal aortic aneurysm (AAA). Diabetes Have regular diabetes screenings. This checks your fasting blood sugar level. Have the screening done: Once every three years after age 60 if you are at a normal weight and have a low risk for diabetes. More often and at a younger age if you are overweight or have a high risk for diabetes. What should I know about preventing infection? Hepatitis B If you have a higher risk for hepatitis B, you should be screened for this virus. Talk with your health care provider to find out if you are at risk for hepatitis B infection. Hepatitis C Blood testing is recommended for: Everyone born from [birth year] through 1964. Anyone with known risk factors  for hepatitis C. Sexually transmitted infections (STIs) You should be screened each year for STIs, including gonorrhea and chlamydia, if: You are sexually active and are younger than 40 years of age. You are older than 40 years of age and your health care provider tells you that you are at risk for this type of infection. Your sexual activity has changed since you were last screened,  and you are at increased risk for chlamydia or gonorrhea. Ask your health care provider if you are at risk. Ask your health care provider about whether you are at high risk for HIV. Your health care provider may recommend a prescription medicine to help prevent HIV infection. If you choose to take medicine to prevent HIV, you should first get tested for HIV. You should then be tested every 3 months for as long as you are taking the medicine. Follow these instructions at home: Alcohol use Do not drink alcohol if your health care provider tells you not to drink. If you drink alcohol: Limit how much you have to 0-2 drinks a day. Know how much alcohol is in your drink. In the [place] one drink equals one 12 oz bottle of beer (355 mL), one 5 oz glass of wine (148 mL), or one 1 oz glass of hard liquor (44 mL). Lifestyle Do not use any products that contain nicotine or tobacco. These products include cigarettes, chewing tobacco, and vaping devices, such as e-cigarettes. If you need help quitting, ask your health care provider. Do not use street drugs. Do not share needles. Ask your health care provider for help if you need support or information about quitting drugs. General instructions Schedule regular health, dental, and eye exams. Stay current with your vaccines. Tell your health care provider if: You often feel depressed. You have ever been abused or do not feel safe at home. Summary Adopting a healthy lifestyle and getting preventive care are important in promoting health and wellness. Follow your health care provider's instructions about healthy diet, exercising, and getting tested or screened for diseases. Follow your health care provider's instructions on monitoring your cholesterol and blood pressure. This information is not intended to replace advice given to you by your health care provider. Make sure you discuss any questions you have with your health care provider. Document Revised:  01/10/2021 Document Reviewed: 01/10/2021 Elsevier Patient Education  [place].

## 2022-03-06 NOTE — Assessment & Plan Note (Addendum)
Recent LDL elevated - encouraged increased fiber, legumes to help improve LDL levels.  The ASCVD Risk score (Arnett DK, et al., 2019) failed to calculate for the following reasons:   The 2019 ASCVD risk score is only valid for ages 74 to 59

## 2022-03-06 NOTE — Assessment & Plan Note (Signed)
Refilled albuterol PRN.

## 2022-03-06 NOTE — Assessment & Plan Note (Signed)
Continue claritin. °

## 2022-03-06 NOTE — Progress Notes (Signed)
Patient ID: Kenneth Nunez, male    DOB: 1981-06-11, 41 y.o.   MRN: [id number]  This visit was conducted in person.  BP 118/70   Pulse 86   Temp 97.9 F (36.6 C) (Temporal)   Ht 5' 11.5" (1.816 m)   Wt 226 lb (102.5 kg)   SpO2 96%   BMI 31.08 kg/m    CC: CPE Subjective:   HPI: Kenneth Nunez is a 41 y.o. 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  No recent labs.   Mild intermittent asthma - triggers are mowing lawn during spring pollen season. Rare albuterol use. No trouble at work. Also uses claritin.   R shoulder pain ongoing for >6 months. Still able to exercise. Denies inciting trauma/injury or falls. H/o biceps tendon strain 09/2020 while at work. No neck pain, radiculopathy or numbness.    Preventative: Flu shot yearly Td 2013, Tdap 11/2017  COVID vaccine - did not complete Seat belt use discussed  Sunscreen use discussed. No changing moles on skin.  Sleep - averaging 6-7 hours/night  Non smoker Alcohol - 3-4 beers/wk Dentist - q6 mo Eye exam - yearly  Caffeine: 2-3 cups coffee/day Lives with wife, 3 children (2 daughters and 1 son) Occ: [employer] Edu: bachelor's Activity: lifting weights 5d/wk, no scheduled cardio Diet: good water, fruits/vegetables daily      Relevant past medical, surgical, family and social history reviewed and updated as indicated. Interim medical history since our last visit reviewed. Allergies and medications reviewed and updated. Outpatient Medications Prior to Visit  Medication Sig Dispense Refill   albuterol (VENTOLIN HFA) 108 (90 Base) MCG/ACT inhaler Inhale 2 puffs into the lungs every 6 (six) hours as needed for wheezing. 18 g 6   No facility-administered medications prior to visit.     Per HPI unless specifically indicated in ROS section below Review of Systems  Constitutional:  Negative for activity change, appetite change, chills, fatigue, fever  and unexpected weight change.  HENT:  Negative for hearing loss.   Eyes:  Negative for visual disturbance.  Respiratory:  Negative for cough, chest tightness, shortness of breath and wheezing.   Cardiovascular:  Negative for chest pain, palpitations and leg swelling.  Gastrointestinal:  Negative for abdominal distention, abdominal pain, blood in stool, constipation, diarrhea, nausea and vomiting.  Genitourinary:  Negative for difficulty urinating and hematuria.  Musculoskeletal:  Negative for arthralgias, myalgias and neck pain.  Skin:  Negative for rash.  Neurological:  Negative for dizziness, seizures, syncope and headaches.  Hematological:  Negative for adenopathy. Does not bruise/bleed easily.  Psychiatric/Behavioral:  Negative for dysphoric mood. The patient is not nervous/anxious.     Objective:  BP 118/70   Pulse 86   Temp 97.9 F (36.6 C) (Temporal)   Ht 5' 11.5" (1.816 m)   Wt 226 lb (102.5 kg)   SpO2 96%   BMI 31.08 kg/m   Wt Readings from Last 3 Encounters:  03/06/22 226 lb (102.5 kg)  12/21/21 232 lb 6 oz (105.4 kg)  12/15/20 231 lb 5 oz (104.9 kg)      Physical Exam Vitals and nursing note reviewed.  Constitutional:      General: He is not in acute distress.    Appearance: Normal appearance. He is well-developed. He is not ill-appearing.  HENT:     Head: Normocephalic and atraumatic.     Right Ear: Hearing, ear canal and external ear normal.  No decreased hearing noted. Tympanic membrane is scarred and perforated.     Left Ear: Hearing, tympanic membrane, ear canal and external ear normal.     Ears:     Comments: Chronic R posterior TM perf with scarring Eyes:     General: No scleral icterus.    Extraocular Movements: Extraocular movements intact.     Conjunctiva/sclera: Conjunctivae normal.     Pupils: Pupils are equal, round, and reactive to light.  Neck:     Thyroid: No thyroid mass or thyromegaly.  Cardiovascular:     Rate and Rhythm: Normal rate and  regular rhythm.     Pulses: Normal pulses.          Radial pulses are 2+ on the right side and 2+ on the left side.     Heart sounds: Normal heart sounds. No murmur heard. Pulmonary:     Effort: Pulmonary effort is normal. No respiratory distress.     Breath sounds: Normal breath sounds. No wheezing, rhonchi or rales.  Abdominal:     General: Bowel sounds are normal. There is no distension.     Palpations: Abdomen is soft. There is no mass.     Tenderness: There is no abdominal tenderness. There is no guarding or rebound.     Hernia: No hernia is present.  Musculoskeletal:        General: Normal range of motion.     Cervical back: Normal range of motion and neck supple.     Right lower leg: No edema.     Left lower leg: No edema.     Comments:  FROM at neck without midline or paraspinous mm tenderness L shoulder WNL R shoulder exam: No deformity of shoulders on inspection. No significant pain with palpation of shoulder landmarks. FROM in abduction and forward flexion. Discomfort/mild weakness with testing SITS in ext rotation. Mild discomfort with empty can sign. Neg Speed test. No impingement. No pain with rotation of humeral head in [facility] joint.   Lymphadenopathy:     Cervical: No cervical adenopathy.  Skin:    General: Skin is warm and dry.     Findings: No rash.  Neurological:     General: No focal deficit present.     Mental Status: He is alert and oriented to person, place, and time.  Psychiatric:        Mood and Affect: Mood normal.        Behavior: Behavior normal.        Thought Content: Thought content normal.        Judgment: Judgment normal.       Results for orders placed or performed in visit on 12/21/21  TSH  Result Value Ref Range   TSH 1.57 0.35 - 5.50 uIU/mL  Vitamin B12  Result Value Ref Range   Vitamin B-12 360 211 - 911 pg/mL    Assessment & Plan:   Problem List Items Addressed This Visit     Healthcare maintenance - Primary (Chronic)     Preventative protocols reviewed and updated unless pt declined. Discussed healthy diet and lifestyle.       Extrinsic asthma    Refilled albuterol PRN.       Relevant Medications   albuterol (VENTOLIN HFA) 108 (90 Base) MCG/ACT inhaler   Seasonal allergic rhinitis    Continue claritin.       Hyperlipidemia    Recent LDL elevated - encouraged increased fiber, legumes to help improve LDL levels.  The ASCVD  Risk score (Arnett DK, et al., 2019) failed to calculate for the following reasons:   The 2019 ASCVD risk score is only valid for ages 10 to 12       Right shoulder pain    Describes story and exam most suspicious for RTC tendinopathy.  Provided with RTC injury exercises from [facility] pt advisor.  Suggested OTC NSAID use.  If not improving with this, he will return to see Dr Lorelei Pont sports medicine.         Meds ordered this encounter  Medications   albuterol (VENTOLIN HFA) 108 (90 Base) MCG/ACT inhaler    Sig: Inhale 2 puffs into the lungs every 6 (six) hours as needed for wheezing.    Dispense:  18 g    Refill:  6   No orders of the defined types were placed in this encounter.   Patient instructions: You are doing well today Try exercises provided for rotator cuff - likely R RTC injury If ongoing, return to see sports medicine Dr Lorelei Pont.  Good to see you today Return as needed or in 1 year for physical.   Follow up plan: Return in about 1 year (around 03/07/2023) for annual exam, prior fasting for blood work.  Ria Bush, MD

## 2022-03-06 NOTE — Assessment & Plan Note (Addendum)
Describes story and exam most suspicious for RTC tendinopathy.  Provided with RTC injury exercises from [facility] pt advisor.  Suggested OTC NSAID use.  If not improving with this, he will return to see Dr Lorelei Pont sports medicine.

## 2022-03-06 NOTE — Assessment & Plan Note (Signed)
Preventative protocols reviewed and updated unless pt declined. Discussed healthy diet and lifestyle.  

## 2023-03-13 ENCOUNTER — Encounter: Payer: Self-pay | Admitting: Family Medicine

## 2023-03-13 ENCOUNTER — Ambulatory Visit (INDEPENDENT_AMBULATORY_CARE_PROVIDER_SITE_OTHER): Payer: 59 | Admitting: Family Medicine

## 2023-03-13 VITALS — BP 118/78 | HR 52 | Temp 97.5°F | Ht 71.75 in | Wt 208.2 lb

## 2023-03-13 DIAGNOSIS — Z0000 Encounter for general adult medical examination without abnormal findings: Secondary | ICD-10-CM

## 2023-03-13 DIAGNOSIS — J4520 Mild intermittent asthma, uncomplicated: Secondary | ICD-10-CM

## 2023-03-13 DIAGNOSIS — E7800 Pure hypercholesterolemia, unspecified: Secondary | ICD-10-CM | POA: Diagnosis not present

## 2023-03-13 MED ORDER — LORATADINE 10 MG PO TABS: 10.0000 mg | ORAL_TABLET | Freq: Every day | ORAL | Status: AC | PRN

## 2023-03-13 NOTE — Assessment & Plan Note (Signed)
Mild, rare albuterol use.

## 2023-03-13 NOTE — Progress Notes (Signed)
Ph: [phone] Fax: [phone]   Patient ID: Kenneth Nunez, male    DOB: 1981-09-02, 42 y.o.   MRN: [id number]  This visit was conducted in person.  BP 118/78   Pulse (!) 52   Temp (!) 97.5 F (36.4 C) (Temporal)   Ht 5' 11.75" (1.822 m)   Wt 208 lb 4 oz (94.5 kg)   SpO2 99%   BMI 28.44 kg/m   BP Readings from Last 3 Encounters:  03/13/23 118/78  03/06/22 118/70  12/21/21 120/82    Pulse Readings from Last 3 Encounters:  03/13/23 (!) 52  03/06/22 86  12/21/21 (!) 53    CC: CPE Subjective:   HPI: Kenneth Nunez is a 41 y.o. 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  Forgot labs today.   Lost 35 lbs. Increased exercise, eating healthier. Overall feeling well.   Mild intermittent asthma - triggers are mowing lawn during spring pollen season. Rare albuterol use. Also uses claritin. No trouble at work.   Preventative: Flu shot occ COVID vaccine - did not complete Td 2013, Tdap 11/2017  Seat belt use discussed  Sunscreen use discussed. No changing moles on skin.  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 Interim medical history since our last visit reviewed. Allergies and medications reviewed and updated. Outpatient Medications Prior to Visit  Medication Sig Dispense Refill   albuterol (VENTOLIN HFA) 108 (90 Base) MCG/ACT inhaler Inhale 2 puffs into the lungs every 6 (six) hours as needed for wheezing. 18 g 6   No facility-administered medications prior to visit.     Per HPI unless specifically indicated in ROS section below Review  of Systems  Constitutional:  Negative for activity change, appetite change, chills, fatigue, fever and unexpected weight change.  HENT:  Negative for hearing loss.   Eyes:  Negative for visual disturbance.  Respiratory:  Negative for cough, chest tightness, shortness of breath and wheezing.   Cardiovascular:  Negative for chest pain, palpitations and leg swelling.  Gastrointestinal:  Negative for abdominal distention, abdominal pain, blood in stool, constipation, diarrhea, nausea and vomiting.  Genitourinary:  Negative for difficulty urinating and hematuria.  Musculoskeletal:  Negative for arthralgias, myalgias and neck pain.  Skin:  Negative for rash.  Neurological:  Negative for dizziness, seizures, syncope and headaches.  Hematological:  Negative for adenopathy. Does not bruise/bleed easily.  Psychiatric/Behavioral:  Negative for dysphoric mood. The patient is not nervous/anxious.     Objective:  BP 118/78   Pulse (!) 52   Temp (!) 97.5 F (36.4 C) (Temporal)   Ht 5' 11.75" (1.822 m)   Wt 208 lb 4 oz (94.5 kg)   SpO2 99%   BMI 28.44 kg/m   Wt Readings from Last 3 Encounters:  03/13/23 208 lb 4 oz (94.5 kg)  03/06/22 226 lb (102.5 kg)  12/21/21 232 lb 6 oz (105.4 kg)      Physical Exam Vitals and nursing note reviewed.  Constitutional:      General: He is not in acute distress.    Appearance: Normal appearance. He  is well-developed. He is not ill-appearing.  HENT:     Head: Normocephalic and atraumatic.     Right Ear: Hearing, tympanic membrane, ear canal and external ear normal.     Left Ear: Hearing, tympanic membrane, ear canal and external ear normal.     Nose: Nose normal.     Mouth/Throat:     Mouth: Mucous membranes are moist.     Pharynx: Oropharynx is clear. No oropharyngeal exudate or posterior oropharyngeal erythema.  Eyes:     General: No scleral icterus.    Extraocular Movements: Extraocular movements intact.     Conjunctiva/sclera: Conjunctivae normal.      Pupils: Pupils are equal, round, and reactive to light.  Neck:     Thyroid: No thyroid mass or thyromegaly.  Cardiovascular:     Rate and Rhythm: Normal rate and regular rhythm.     Pulses: Normal pulses.          Radial pulses are 2+ on the right side and 2+ on the left side.     Heart sounds: Normal heart sounds. No murmur heard. Pulmonary:     Effort: Pulmonary effort is normal. No respiratory distress.     Breath sounds: Normal breath sounds. No wheezing, rhonchi or rales.  Abdominal:     General: Bowel sounds are normal. There is no distension.     Palpations: Abdomen is soft. There is no mass.     Tenderness: There is no abdominal tenderness. There is no guarding or rebound.     Hernia: No hernia is present.  Musculoskeletal:        General: Normal range of motion.     Cervical back: Normal range of motion and neck supple.     Right lower leg: No edema.     Left lower leg: No edema.  Lymphadenopathy:     Cervical: No cervical adenopathy.  Skin:    General: Skin is warm and dry.     Findings: No rash.  Neurological:     General: No focal deficit present.     Mental Status: He is alert and oriented to person, place, and time.  Psychiatric:        Mood and Affect: Mood normal.        Behavior: Behavior normal.        Thought Content: Thought content normal.        Judgment: Judgment normal.       Results for orders placed or performed in visit on 12/21/21  TSH  Result Value Ref Range   TSH 1.57 0.35 - 5.50 uIU/mL  Vitamin B12  Result Value Ref Range   Vitamin B-12 360 211 - 911 pg/mL    Assessment & Plan:   Problem List Items Addressed This Visit     Healthcare maintenance - Primary (Chronic)    Preventative protocols reviewed and updated unless pt declined. Discussed healthy diet and lifestyle.  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  Recent labs through work as well - asked to bring [place] a copy for  our records.      Extrinsic asthma    Mild, rare albuterol use.       Hyperlipidemia    Chronic, anticipate improved control with healthy diet increased exercise and weight loss over the past year. Reviewed diet choices to improve LDL. I asked him to bring copy of latest labs to review. The ASCVD Risk score (Arnett DK,  et al., 2019) failed to calculate for the following reasons:   Cannot find a previous HDL lab   Cannot find a previous total cholesterol lab         Meds ordered this encounter  Medications   loratadine (CLARITIN) 10 MG tablet    Sig: Take 1 tablet (10 mg total) by mouth daily as needed for allergies.    No orders of the defined types were placed in this encounter.   Patient Instructions  You are doing well today Return as needed or in 1-2 years for next physical.   Follow up plan: Return in about 1 year (around 03/12/2024) for annual exam, prior fasting for blood work.  Eustaquio Boyden, MD

## 2023-03-13 NOTE — Assessment & Plan Note (Addendum)
Preventative protocols reviewed and updated unless pt declined. Discussed healthy diet and lifestyle.  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  Recent labs through work as well - asked to bring [place] a copy for our records.

## 2023-03-13 NOTE — Assessment & Plan Note (Signed)
Chronic, anticipate improved control with healthy diet increased exercise and weight loss over the past year. Reviewed diet choices to improve LDL. I asked him to bring copy of latest labs to review. The ASCVD Risk score (Arnett DK, et al., 2019) failed to calculate for the following reasons:   Cannot find a previous HDL lab   Cannot find a previous total cholesterol lab

## 2023-03-13 NOTE — Patient Instructions (Addendum)
You are doing well today Return as needed or in 1-2 years for next physical.

## 2023-08-28 ENCOUNTER — Ambulatory Visit: Payer: 59 | Admitting: Internal Medicine

## 2023-08-28 ENCOUNTER — Encounter: Payer: Self-pay | Admitting: Internal Medicine

## 2023-08-28 ENCOUNTER — Ambulatory Visit: Payer: Self-pay | Admitting: Family Medicine

## 2023-08-28 VITALS — BP 112/76 | HR 64 | Temp 98.2°F | Ht 71.75 in | Wt 215.0 lb

## 2023-08-28 DIAGNOSIS — B029 Zoster without complications: Secondary | ICD-10-CM | POA: Insufficient documentation

## 2023-08-28 MED ORDER — VALACYCLOVIR HCL 1 G PO TABS: 1000.0000 mg | ORAL_TABLET | Freq: Three times a day (TID) | ORAL | 1 refills | Status: DC

## 2023-08-28 NOTE — Progress Notes (Signed)
   Subjective:    Patient ID: Kenneth Nunez, male    DOB: 06-20-1981, 42 y.o.   MRN: [id number]  HPI Here due to a rash on his neck  Rash on posterior right head and some on neck Neck feels tight First noticed 4-5 days ago Lots of bumps  Hasn't done anything other than tylenol  Current Outpatient Medications on File Prior to Visit  Medication Sig Dispense Refill   albuterol (VENTOLIN HFA) 108 (90 Base) MCG/ACT inhaler Inhale 2 puffs into the lungs every 6 (six) hours as needed for wheezing. 18 g 6   loratadine (CLARITIN) 10 MG tablet Take 1 tablet (10 mg total) by mouth daily as needed for allergies.     No current facility-administered medications on file prior to visit.    No Known Allergies  Past Medical History:  Diagnosis Date   Extrinsic asthma    mild, intermittent   History of chicken pox    Seasonal allergic rhinitis     Past Surgical History:  Procedure Laterality Date   TYMPANOPLASTY  1983   VASECTOMY  10/2017    Family History  Problem Relation Age of Onset   Depression Mother    Hypertension Mother    Hyperlipidemia Father    CAD Maternal Grandmother 61       MI   Hyperlipidemia Paternal Grandmother    Cancer Neg Hx    Stroke Neg Hx     Social History   Socioeconomic History   Marital status: Married    Spouse name: Not on file   Number of children: Not on file   Years of education: Not on file   Highest education level: Not on file  Occupational History   Not on file  Tobacco Use   Smoking status: Never   Smokeless tobacco: Former  Building services engineer status: Never Used  Substance and Sexual Activity   Alcohol use: Yes    Comment: rarely   Drug use: No   Sexual activity: Yes  Other Topics Concern   Not on file  Social History Narrative   Caffeine: 2-3 cups coffee/day   Lives with wife and daughter, 1 dog, 1 fish   Occ: IT sales professional   Edu: bachelor's   Activity: working out   Diet: some water, fruits/vegetables daily    Social Drivers of Corporate investment banker Strain: Not on Ship broker Insecurity: Not on file  Transportation Needs: Not on file  Physical Activity: Not on file  Stress: Not on file  Social Connections: Not on file  Intimate Partner Violence: Not on file   Review of Systems No fever Had diarrhea and felt sick before this---then the rash came out    Objective:   Physical Exam Constitutional:      Appearance: Normal appearance.  Skin:    Comments: Papulovesicular lesions--some in clumps along C3 distribution on right posterior scalp Several areas with early crusting  Neurological:     Mental Status: He is alert.            Assessment & Plan:

## 2023-08-28 NOTE — Telephone Encounter (Signed)
  Chief Complaint: neck pain Symptoms: bumps, swollen Frequency: constant  Disposition: [] ED /[] Urgent Care (no appt availability in office) / [x] Appointment(In office/virtual)/ []  [place] [facility]/ [] Home Care/ [] Refused Recommended Disposition /[] [facility]/ []  Follow-up with PCP Additional Notes: Pt noticed 4 days painful bumps on back of quarter of head. Sizes are random, but all red in color.  Pain rated pain 3-4 and constant. Newer symptom is painful to turn neck. Pt stated, "My neck feels like its tightening when I turn it.It's like it's traveling down" Pt stated bumps are not new to him. They come and go at random times. Never been painful like this before. Pt had stomach bug last Wednesday and bumps showed up 2 days later. Pt has appt today at 12:15 with [facility] provider. Pt understands all care advice.         Copied from CRM [id number]. Topic: Clinical - Red Word Triage >> Aug 28, 2023  9:17 AM Gurney Maxin H wrote: Red Word that prompted transfer to Nurse Triage: Patient states scalp in the back and right side is swollen and very painful, and has bumps neck getting tight for 4 days not getting better, was recently sick not sure if that has something to do with it. Reason for Disposition  Numbness in an arm or hand (i.e., loss of sensation)  Answer Assessment - Initial Assessment Questions 1. ONSET: "When did the pain begin?"      4 days ago 2. LOCATION: "Where does it hurt?"      Back of head/down neck 3. PATTERN "Does the pain come and go, or has it been constant since it started?"      constant 4. SEVERITY: "How bad is the pain?"  (Scale 1-10; or mild, moderate, severe)   - NO PAIN (0): no pain or only slight stiffness    - MILD (1-3): doesn't interfere with normal activities    - MODERATE (4-7): interferes with normal activities or awakens from sleep    - SEVERE (8-10):  excruciating pain, unable to do any normal activities      Pain 3-4 5. RADIATION:  "Does the pain go anywhere else, shoot into your arms?"     no 6. CORD SYMPTOMS: "Any weakness or numbness of the arms or legs?"     no 7. CAUSE: "What do you think is causing the neck pain?"     unsure 8. NECK OVERUSE: "Any recent activities that involved turning or twisting the neck?"     no 9. OTHER SYMPTOMS: "Do you have any other symptoms?" (e.g., headache, fever, chest pain, difficulty breathing, neck swelling)     no  Protocols used: Neck Pain or Stiffness-A-AH

## 2023-08-28 NOTE — Assessment & Plan Note (Addendum)
Discussed natural history Will try valacyclovir 1 gm tid x 7 days (with refill) to reduce risk for post herpetic neuralgia Recommended topical lidocaine for pain relief Consider gabapentin if worsens

## 2023-08-30 NOTE — Telephone Encounter (Signed)
Appreciate Dr Alphonsus Sias seeing pt, dx with shingles.

## 2023-09-03 ENCOUNTER — Encounter: Payer: Self-pay | Admitting: Internal Medicine

## 2024-08-13 ENCOUNTER — Other Ambulatory Visit (HOSPITAL_BASED_OUTPATIENT_CLINIC_OR_DEPARTMENT_OTHER): Payer: Self-pay | Admitting: Family Medicine

## 2024-08-13 DIAGNOSIS — Z8249 Family history of ischemic heart disease and other diseases of the circulatory system: Secondary | ICD-10-CM

## 2024-08-14 ENCOUNTER — Ambulatory Visit (HOSPITAL_BASED_OUTPATIENT_CLINIC_OR_DEPARTMENT_OTHER)
Admission: RE | Admit: 2024-08-14 | Discharge: 2024-08-14 | Disposition: A | Discharge status: Home / Self Care | Payer: Self-pay | Source: Ambulatory Visit | Attending: Family Medicine | Admitting: Family Medicine

## 2024-08-14 DIAGNOSIS — Z8249 Family history of ischemic heart disease and other diseases of the circulatory system: Secondary | ICD-10-CM

## 2024-08-20 ENCOUNTER — Telehealth: Payer: Self-pay | Admitting: *Deleted

## 2024-08-20 ENCOUNTER — Encounter: Payer: Self-pay | Admitting: Family

## 2024-08-20 ENCOUNTER — Ambulatory Visit: Admitting: Family

## 2024-08-20 ENCOUNTER — Telehealth: Payer: Self-pay | Admitting: Family Medicine

## 2024-08-20 VITALS — BP 124/74 | HR 79 | Temp 98.1°F | Ht 72.0 in | Wt 220.0 lb

## 2024-08-20 DIAGNOSIS — Z0000 Encounter for general adult medical examination without abnormal findings: Secondary | ICD-10-CM

## 2024-08-20 DIAGNOSIS — E7800 Pure hypercholesterolemia, unspecified: Secondary | ICD-10-CM

## 2024-08-20 DIAGNOSIS — E538 Deficiency of other specified B group vitamins: Secondary | ICD-10-CM | POA: Diagnosis not present

## 2024-08-20 DIAGNOSIS — E875 Hyperkalemia: Secondary | ICD-10-CM | POA: Diagnosis not present

## 2024-08-20 LAB — VITAMIN B12: Vitamin B-12: 381 pg/mL (ref 211–911)

## 2024-08-20 LAB — LIPID PANEL
Cholesterol: 254 mg/dL — ABNORMAL HIGH (ref 28–200)
HDL: 75.9 mg/dL (ref >39.00)
LDL Cholesterol: 156 mg/dL — ABNORMAL HIGH (ref 10–99)
NonHDL: 178.53
Total CHOL/HDL Ratio: 3
Triglycerides: 111 mg/dL (ref 10.0–149.0)
VLDL: 22.2 mg/dL (ref 0.0–40.0)

## 2024-08-20 LAB — POTASSIUM: Potassium: 3.8 meq/L (ref 3.5–5.1)

## 2024-08-20 NOTE — Telephone Encounter (Signed)
 Pt would like to transfer care to [facility] from Dr. Rilla.

## 2024-08-20 NOTE — Telephone Encounter (Signed)
 This is fine I will accept TOC

## 2024-08-20 NOTE — Telephone Encounter (Signed)
 Pt would like a TOC from Dr KANDICE to [place]. Please advise.

## 2024-08-20 NOTE — Progress Notes (Signed)
 "  Subjective:  Patient ID: Kenneth Nunez, male    DOB: [birth year]  Age: 43 y.o. 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 Normally sees Dr. KANDICE but would like to switch to Rhianon Zabawa.    HPI Kenneth Nunez is a 43 y.o. male who presents today for an annual physical exam. He reports consuming a general diet. Gym/ health club routine includes home gyn 3-4 times a week kettle bells. He generally feels well. He reports sleeping well. He does have additional problems to discuss today.   Vision:Within last year Dental:Receives regular dental care  Pt is with acute concerns.   Discussed the use of AI scribe software for clinical note transcription with the patient, who gave verbal consent to proceed.  History of Present Illness Kenneth Nunez is a 43 year old male who presents with elevated potassium levels and high cholesterol.  He has recently noted slightly elevated potassium levels on his blood work. He was not fasting at the time of the test. He is not taking any over-the-counter supplements and does not consume excessive amounts of potassium-rich foods such as mangoes, melons, or bananas. He takes creatine and protein supplements.  No chest pain, palpitations, or shortness of breath. Reports left ear ringing for five years, possibly related to past ear issues and exposure to loud noises.  Advanced Directives Patient does not have advanced directives     DEPRESSION SCREENING    03/13/2023    9:05 AM 03/06/2022    2:53 PM 01/13/2020   11:41 AM 11/07/2017   10:29 AM  PHQ 2/9 Scores  PHQ - 2 Score 0 0 0 0     ROS: Negative unless specifically indicated above in HPI.   Current Medications[1]    Objective:    BP 124/74 (BP Location: Left Arm, Patient  Position: Sitting, Cuff Size: Large)   Pulse 79   Temp 98.1 F (36.7 C) (Temporal)   Ht 6' (1.829 m)   Wt 220 lb (99.8 kg)   SpO2 99%   BMI 29.84 kg/m   BP Readings from Last 3 Encounters:  08/20/24 124/74  08/28/23 112/76  03/13/23 118/78      Physical Exam Vitals reviewed.  Constitutional:      General: He is not in acute distress.    Appearance: Normal appearance. He is normal weight. He is not ill-appearing, toxic-appearing or diaphoretic.  HENT:     Head: Normocephalic.     Right Ear: Tympanic membrane normal.     Left Ear: Tympanic membrane normal.     Nose: Nose normal.     Mouth/Throat:     Mouth: Mucous membranes are moist.  Eyes:     Pupils: Pupils are equal, round, and reactive to light.  Cardiovascular:     Rate and Rhythm: Normal rate and regular rhythm.  Pulmonary:     Effort: Pulmonary effort is normal.     Breath sounds: Normal breath sounds.  Abdominal:     General: Abdomen is flat.     Tenderness: There is no abdominal tenderness.  Musculoskeletal:        General: Normal range of motion.  Skin:    General: Skin is warm.  Neurological:     General: No  focal deficit present.     Mental Status: He is alert and oriented to person, place, and time. Mental status is at baseline.  Psychiatric:        Mood and Affect: Mood normal.        Behavior: Behavior normal.        Thought Content: Thought content normal.        Judgment: Judgment normal.       Results Labs K (08/13/2024): 5.3, mildly elevated LDL (08/13/2024): Elevated Total cholesterol (08/13/2024): Elevated PSA (08/13/2024): Within normal limits CBC (08/13/2024): Within normal limits CMP (08/13/2024): Within normal limits  Radiology Coronary artery calcium score (08/14/2024): Calcium score 0; normal origin; circumflex artery normal; no evidence of atherosclerosis      Assessment & Plan:   Assessment and Plan Assessment & Plan Hyperkalemia Mildly elevated potassium levels,  likely due to lab draw error or dehydration. No dietary or supplement intake contributing to hyperkalemia. - Will retest potassium levels to confirm results.  Pure hypercholesterolemia Chronic hypercholesterolemia with family history of cardiovascular disease. Recent calcium score of zero indicates no significant atherosclerosis. Non-fasting cholesterol levels were elevated, but retesting is planned. Discussed potential genetic component with LPA testing. - Will retest cholesterol levels after fasting. - Will consider LPA testing to assess genetic risk for cardiovascular disease. - Provided cholesterol management educational materials.  General Health Maintenance Routine health maintenance discussed. No recent flu or hepatitis B vaccinations. Tetanus vaccination is up to date. Eye exam due in January. Dental exams every six months. No family history of colon cancer; colon screening to start at age 40. No advanced directives or medical directives on file. - Schedule eye exam in January. - Continue dental exams every six months. - Consider obtaining a medical directive and provide a copy to the clinic.          Follow-up: Return in about 1 year (around 08/20/2025) for f/u CPE.   Ginger Patrick, FNP      [1]  Current Outpatient Medications:    albuterol  (VENTOLIN  HFA) 108 (90 Base) MCG/ACT inhaler, Inhale 2 puffs into the lungs every 6 (six) hours as needed for wheezing., Disp: 18 g, Rfl: 6   loratadine  (CLARITIN ) 10 MG tablet, Take 1 tablet (10 mg total) by mouth daily as needed for allergies., Disp: , Rfl:   "

## 2024-08-20 NOTE — Telephone Encounter (Signed)
 Ok to transfer care. Nice patient.

## 2024-08-21 ENCOUNTER — Ambulatory Visit: Payer: Self-pay | Admitting: Family

## 2024-08-25 LAB — LIPOPROTEIN A (LPA): Lipoprotein (a): <10 nmol/L

## 2025-08-21 ENCOUNTER — Encounter: Admitting: Family
